# Patient Record
Sex: Male | Born: 1950 | Race: White | Hispanic: No | Marital: Married | Smoking: Never smoker
Health system: Southern US, Community
[De-identification: ages and names within clinical notes are randomized; demographics above are authoritative.]

## PROBLEM LIST (undated history)

## (undated) DIAGNOSIS — I10 Essential (primary) hypertension: Secondary | ICD-10-CM

## (undated) DIAGNOSIS — Z86010 Personal history of colonic polyps: Secondary | ICD-10-CM

## (undated) DIAGNOSIS — E7800 Pure hypercholesterolemia, unspecified: Secondary | ICD-10-CM

## (undated) HISTORY — PX: COLONOSCOPY: SHX174

## (undated) HISTORY — DX: Essential (primary) hypertension: I10

## (undated) HISTORY — DX: Pure hypercholesterolemia, unspecified: E78.00

## (undated) HISTORY — PX: TONSILLECTOMY: SUR1361

## (undated) HISTORY — PX: CYST REMOVAL NECK: SHX6281

---
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
## 2007-03-03 ENCOUNTER — Encounter: Payer: Self-pay | Admitting: Internal Medicine

## 2008-03-21 ENCOUNTER — Encounter: Payer: Self-pay | Admitting: Internal Medicine

## 2008-10-16 ENCOUNTER — Encounter (INDEPENDENT_AMBULATORY_CARE_PROVIDER_SITE_OTHER): Payer: Self-pay | Admitting: *Deleted

## 2008-10-17 ENCOUNTER — Encounter: Payer: Self-pay | Admitting: Internal Medicine

## 2008-10-24 ENCOUNTER — Ambulatory Visit: Payer: Self-pay | Admitting: Internal Medicine

## 2008-11-14 ENCOUNTER — Ambulatory Visit: Payer: Self-pay | Admitting: Internal Medicine

## 2010-06-09 ENCOUNTER — Ambulatory Visit
Admission: RE | Admit: 2010-06-09 | Discharge: 2010-06-09 | Payer: Self-pay | Source: Home / Self Care | Attending: Surgery | Admitting: Surgery

## 2011-06-18 ENCOUNTER — Other Ambulatory Visit: Payer: Self-pay | Admitting: Family Medicine

## 2011-06-18 ENCOUNTER — Ambulatory Visit
Admission: RE | Admit: 2011-06-18 | Discharge: 2011-06-18 | Disposition: A | Discharge status: Home / Self Care | Payer: BC Managed Care – PPO | Source: Ambulatory Visit | Attending: Family Medicine | Admitting: Family Medicine

## 2011-06-18 DIAGNOSIS — M79673 Pain in unspecified foot: Secondary | ICD-10-CM

## 2014-02-19 ENCOUNTER — Other Ambulatory Visit: Payer: Self-pay | Admitting: Family Medicine

## 2014-02-19 ENCOUNTER — Ambulatory Visit
Admission: RE | Admit: 2014-02-19 | Discharge: 2014-02-19 | Disposition: A | Discharge status: Home / Self Care | Payer: BC Managed Care – PPO | Source: Ambulatory Visit | Attending: Family Medicine | Admitting: Family Medicine

## 2014-02-19 DIAGNOSIS — R0602 Shortness of breath: Secondary | ICD-10-CM

## 2014-06-26 ENCOUNTER — Encounter: Payer: Self-pay | Admitting: Internal Medicine

## 2015-10-23 ENCOUNTER — Other Ambulatory Visit: Payer: Self-pay | Admitting: Family Medicine

## 2015-10-23 ENCOUNTER — Ambulatory Visit
Admission: RE | Admit: 2015-10-23 | Discharge: 2015-10-23 | Disposition: A | Discharge status: Home / Self Care | Payer: BLUE CROSS/BLUE SHIELD | Source: Ambulatory Visit | Attending: Family Medicine | Admitting: Family Medicine

## 2015-10-23 DIAGNOSIS — M25562 Pain in left knee: Secondary | ICD-10-CM

## 2018-08-14 ENCOUNTER — Other Ambulatory Visit: Payer: Self-pay | Admitting: Family Medicine

## 2018-08-14 ENCOUNTER — Ambulatory Visit
Admission: RE | Admit: 2018-08-14 | Discharge: 2018-08-14 | Disposition: A | Discharge status: Home / Self Care | Payer: BLUE CROSS/BLUE SHIELD | Source: Ambulatory Visit | Attending: Family Medicine | Admitting: Family Medicine

## 2018-08-14 DIAGNOSIS — R52 Pain, unspecified: Secondary | ICD-10-CM

## 2018-08-25 ENCOUNTER — Encounter (INDEPENDENT_AMBULATORY_CARE_PROVIDER_SITE_OTHER): Payer: Self-pay | Admitting: Orthopaedic Surgery

## 2018-08-25 ENCOUNTER — Other Ambulatory Visit: Payer: Self-pay

## 2018-08-25 ENCOUNTER — Ambulatory Visit (INDEPENDENT_AMBULATORY_CARE_PROVIDER_SITE_OTHER): Payer: BLUE CROSS/BLUE SHIELD | Admitting: Orthopaedic Surgery

## 2018-08-25 VITALS — BP 163/100 | HR 70 | Ht 71.0 in | Wt 270.0 lb

## 2018-08-25 DIAGNOSIS — M25562 Pain in left knee: Secondary | ICD-10-CM

## 2018-08-25 DIAGNOSIS — G8929 Other chronic pain: Secondary | ICD-10-CM

## 2018-08-25 NOTE — Progress Notes (Signed)
Office Visit Note   Patient: Miguel Peterson           Date of Birth: 1950-12-08           MRN: [id number] Visit Date: 08/25/2018              Requested by: Derinda Late, MD [address] [place], [place] [address] PCP: Derinda Late, MD   Assessment & Plan: Visit Diagnoses:  1. Chronic pain of left knee     Plan: Episodic left knee pain over period of several months.  No history of injury or trauma.  There may be a little bit of swelling.  Most of the pain seems to be localized along the lateral compartment.  I think there is several diagnostic possibilities including some localized arthritis and possibly even a tear of the lateral meniscus.  I have had a long discussion regarding treatment options and including cortisone injection, NSAIDs and possibly even an MRI scan.  Presently not having too much trouble so I would suggest just using anti-inflammatory medicines and activity as tolerated.  Follow-Up Instructions: Return if symptoms worsen or fail to improve.   Orders:  No orders of the defined types were placed in this encounter.  No orders of the defined types were placed in this encounter.     Procedures: No procedures performed   Clinical Data: No additional findings.   Subjective: Chief Complaint  Patient presents with  . Left Knee - Pain  Patient presents today with left knee pain X 5 weeks. No known injury. He said that is hurts on the posterior and lateral sides of his knee. He said that it feels swollen. No popping, clicking, grinding, or catching. He said that the pain occurs randomly. He saw his PCP and had x-rays taken on 08/14/2018. I did review the films of his right knee in the PACS system.  There is some mild degenerative changes in all 3 compartments.  The joint spaces are well-maintained.  There is very minimal varus position.  HPI  Review of Systems   Objective: Vital Signs: BP (!) 163/100   Pulse 70   Ht 5\' 11"  (1.803 m)   Wt 270 lb  (122.5 kg)   BMI 37.66 kg/m   Physical Exam Constitutional:      Appearance: He is well-developed.  Eyes:     Pupils: Pupils are equal, round, and reactive to light.  Pulmonary:     Effort: Pulmonary effort is normal.  Skin:    General: Skin is warm and dry.  Neurological:     Mental Status: He is alert and oriented to person, place, and time.  Psychiatric:        Behavior: Behavior normal.     Ortho Exam awake alert and oriented x3.  Comfortable sitting.  Straight leg raise negative.  Painless range of motion both hips.  Left knee without effusion.  Little bit of patella crepitation but no pain.  Some pain along the lateral compartment but quite mild.  No pain medially.  Full extension and flexion over 105 degrees.  No instability.  No calf pain or popliteal fullness.  Neurologically intact Specialty Comments:  No specialty comments available.  Imaging: No results found.   PMFS History: There are no active problems to display for this patient.  Past Medical History:  Diagnosis Date  . High cholesterol     History reviewed. No pertinent family history.  History reviewed. No pertinent surgical history. Social History  Occupational History  . Not on file  Tobacco Use  . Smoking status: Never Smoker  . Smokeless tobacco: Never Used  Substance and Sexual Activity  . Alcohol use: Yes  . Drug use: Never  . Sexual activity: Not on file

## 2018-10-25 ENCOUNTER — Encounter: Payer: Self-pay | Admitting: Internal Medicine

## 2018-10-26 ENCOUNTER — Encounter: Payer: Self-pay | Admitting: Internal Medicine

## 2018-11-21 ENCOUNTER — Other Ambulatory Visit: Payer: Self-pay

## 2018-11-21 ENCOUNTER — Ambulatory Visit: Payer: BC Managed Care – PPO

## 2018-11-21 VITALS — Ht 71.0 in | Wt 270.0 lb

## 2018-11-21 DIAGNOSIS — Z1211 Encounter for screening for malignant neoplasm of colon: Secondary | ICD-10-CM

## 2018-11-21 NOTE — Progress Notes (Signed)
Per pt, no allergies to soy or egg products.Pt not taking any weight loss meds or using  O2 at home. Pt denies sedation problems.   Pt refused emmi video.  The PV was done over the phone due to COVID-19. I verified the pt's address and insurance. I reviewed prep instructions and medical hx with the pt and will mail his instructions to him today. Informed the pt to call with any questions or changes prior to his procedure.  He understood.

## 2018-12-04 ENCOUNTER — Telehealth: Payer: Self-pay | Admitting: Internal Medicine

## 2018-12-04 NOTE — Telephone Encounter (Signed)
Spoke with patient regarding Covid-19 screening questions °Covid-19 Screening Questions ° °Do you now or have you had a fever in the last 14 days?     No ° °Do you have any respiratory symptoms of shortness of breath or cough now or in the last 14 days?    No ° °Do you have any family members or close contacts with diagnosed or suspected Covid-19 in the past 14 days?     No ° °Have you been tested for Covid-19 and found to be positive?    No ° °Pt made aware of that care partner may wait in the car or come up to the lobby during the procedure but will need to provide their own mask. ° °  °

## 2018-12-05 ENCOUNTER — Encounter: Payer: Self-pay | Admitting: Internal Medicine

## 2018-12-05 ENCOUNTER — Ambulatory Visit (AMBULATORY_SURGERY_CENTER): Payer: BC Managed Care – PPO | Admitting: Internal Medicine

## 2018-12-05 ENCOUNTER — Other Ambulatory Visit: Payer: Self-pay

## 2018-12-05 VITALS — BP 147/79 | HR 64 | Temp 97.4°F | Resp 14 | Ht 71.0 in | Wt 270.0 lb

## 2018-12-05 DIAGNOSIS — D122 Benign neoplasm of ascending colon: Secondary | ICD-10-CM

## 2018-12-05 DIAGNOSIS — Z1211 Encounter for screening for malignant neoplasm of colon: Secondary | ICD-10-CM

## 2018-12-05 DIAGNOSIS — D123 Benign neoplasm of transverse colon: Secondary | ICD-10-CM | POA: Diagnosis not present

## 2018-12-05 MED ORDER — SODIUM CHLORIDE 0.9 % IV SOLN: 500.0000 mL | Freq: Once | INTRAVENOUS | Status: DC

## 2018-12-05 NOTE — Progress Notes (Signed)
Report to PACU, RN, vss, BBS= Clear.  

## 2018-12-05 NOTE — Progress Notes (Signed)
Pt's states no medical or surgical changes since previsit or office visit.  Temp CW Vitals JB 

## 2018-12-05 NOTE — Progress Notes (Signed)
Called to room to assist during endoscopic procedure.  Patient ID and intended procedure confirmed with present staff. Received instructions for my participation in the procedure from the performing physician.  

## 2018-12-05 NOTE — Op Note (Signed)
[facility] Patient Name: Miguel Peterson Procedure Date: 12/05/2018 9:11 AM MRN: [id number] Endoscopist: Gatha Mayer , MD Age: 68 Referring MD:  Date of Birth: 1951-05-24 Gender: Male Account #: [id number] Procedure:                Colonoscopy Indications:              Screening for colorectal malignant neoplasm, Last                            colonoscopy: 2010 Medicines:                Propofol per Anesthesia, Monitored Anesthesia Care Procedure:                Pre-Anesthesia Assessment:                           - Prior to the procedure, a History and Physical                            was performed, and patient medications and                            allergies were reviewed. The patient's tolerance of                            previous anesthesia was also reviewed. The risks                            and benefits of the procedure and the sedation                            options and risks were discussed with the patient.                            All questions were answered, and informed consent                            was obtained. Prior Anticoagulants: The patient has                            taken no previous anticoagulant or antiplatelet                            agents. ASA Grade Assessment: II - A patient with                            mild systemic disease. After reviewing the risks                            and benefits, the patient was deemed in                            satisfactory condition to undergo the procedure.  After obtaining informed consent, the colonoscope                            was passed under direct vision. Throughout the                            procedure, the patient's blood pressure, pulse, and                            oxygen saturations were monitored continuously. The                            Colonoscope was introduced through the anus and                            advanced to the the  cecum, identified by                            appendiceal orifice and ileocecal valve. The                            colonoscopy was performed without difficulty. The                            patient tolerated the procedure well. The quality                            of the bowel preparation was good. The bowel                            preparation used was Miralax via split dose                            instruction. Anatomical landmarks were photographed. Scope In: 9:21:54 AM Scope Out: 9:39:38 AM Scope Withdrawal Time: 0 hours 8 minutes 10 seconds  Total Procedure Duration: 0 hours 17 minutes 44 seconds  Findings:                 The perianal and digital rectal examinations were                            normal.                           Four sessile polyps were found in the hepatic                            flexure. The polyps were diminutive in size. These                            polyps were removed with a cold snare. Resection                            and retrieval were complete. Verification of  patient identification for the specimen was done.                            Estimated blood loss was minimal.                           Multiple diverticula were found in the sigmoid                            colon and descending colon.                           Internal hemorrhoids were found.                           The exam was otherwise without abnormality on                            direct and retroflexion views. Complications:            No immediate complications. Estimated Blood Loss:     Estimated blood loss was minimal. Impression:               - Four diminutive polyps at the hepatic flexure,                            removed with a cold snare. Resected and retrieved.                           - Diverticulosis in the sigmoid colon and in the                            descending colon.                           - Internal  hemorrhoids.                           - The examination was otherwise normal on direct                            and retroflexion views. Recommendation:           - Patient has a contact number available for                            emergencies. The signs and symptoms of potential                            delayed complications were discussed with the                            patient. Return to normal activities tomorrow.                            Written discharge instructions were provided to the  patient.                           - Resume previous diet.                           - Continue present medications.                           - Repeat colonoscopy is recommended. The                            colonoscopy date will be determined after pathology                            results from today's exam become available for                            review. Gatha Mayer, MD 12/05/2018 9:56:07 AM This report has been signed electronically.

## 2018-12-05 NOTE — Patient Instructions (Addendum)
I found and removed 4 tiny polyps today.  I will let you know pathology results and when to have another routine colonoscopy by mail and/or My Chart.  You also have a condition called diverticulosis - common and not usually a problem. Please read the handout provided.  I appreciate the opportunity to care for you. Gatha Mayer, MD, [facility]  Polyps (handout given) Diverticulosis (handout given) Hemorrhoids (handout given)YOU HAD AN ENDOSCOPIC PROCEDURE TODAY AT [facility]:   Refer to the procedure report that was given to you for any specific questions about what was found during the examination.  If the procedure report does not answer your questions, please call your gastroenterologist to clarify.  If you requested that your care partner not be given the details of your procedure findings, then the procedure report has been included in a sealed envelope for you to review at your convenience later.  YOU SHOULD EXPECT: Some feelings of bloating in the abdomen. Passage of more gas than usual.  Walking can help get rid of the air that was put into your GI tract during the procedure and reduce the bloating. If you had a lower endoscopy (such as a colonoscopy or flexible sigmoidoscopy) you may notice spotting of blood in your stool or on the toilet paper. If you underwent a bowel prep for your procedure, you may not have a normal bowel movement for a few days.  Please Note:  You might notice some irritation and congestion in your nose or some drainage.  This is from the oxygen used during your procedure.  There is no need for concern and it should clear up in a day or so.  SYMPTOMS TO REPORT IMMEDIATELY:   Following lower endoscopy (colonoscopy or flexible sigmoidoscopy):  Excessive amounts of blood in the stool  Significant tenderness or worsening of abdominal pains  Swelling of the abdomen that is new, acute  Fever of 100F or higher   For urgent or emergent  issues, a gastroenterologist can be reached at any hour by calling [phone].   DIET:  We do recommend a small meal at first, but then you may proceed to your regular diet.  Drink plenty of fluids but you should avoid alcoholic beverages for 24 hours.  ACTIVITY:  You should plan to take it easy for the rest of today and you should NOT DRIVE or use heavy machinery until tomorrow (because of the sedation medicines used during the test).    FOLLOW UP: Our staff will call the number listed on your records 48-72 hours following your procedure to check on you and address any questions or concerns that you may have regarding the information given to you following your procedure. If we do not reach you, we will leave a message.  We will attempt to reach you two times.  During this call, we will ask if you have developed any symptoms of COVID 19. If you develop any symptoms (ie: fever, flu-like symptoms, shortness of breath, cough etc.) before then, please call [phone].  If you test positive for Covid 19 in the 2 weeks post procedure, please call and report this information to [place].    If any biopsies were taken you will be contacted by phone or by letter within the next 1-3 weeks.  Please call us at [phone] if you have not heard about the biopsies in 3 weeks.    SIGNATURES/CONFIDENTIALITY: You and/or your care partner have signed paperwork which will  be entered into your electronic medical record.  These signatures attest to the fact that that the information above on your After Visit Summary has been reviewed and is understood.  Full responsibility of the confidentiality of this discharge information lies with you and/or your care-partner. 

## 2018-12-07 ENCOUNTER — Telehealth: Payer: Self-pay

## 2018-12-07 NOTE — Telephone Encounter (Signed)
  Follow up Call-  Call back number 12/05/2018  Post procedure Call Back phone  # [phone]  Permission to leave phone message Yes  Some recent data might be hidden     Patient questions:  Do you have a fever, pain , or abdominal swelling? No. Pain Score  0 *  Have you tolerated food without any problems? Yes.    Have you been able to return to your normal activities? Yes.    Do you have any questions about your discharge instructions: Diet   No. Medications  No. Follow up visit  No.  Do you have questions or concerns about your Care? No.  Actions: * If pain score is 4 or above: No action needed, pain <4.  1. Have you developed a fever since your procedure? No  2.   Have you had an respiratory symptoms (SOB or cough) since your procedure? no  3.   Have you tested positive for COVID 19 since your procedure no  4.   Have you had any family members/close contacts diagnosed with the COVID 19 since your procedure?  no   If yes to any of these questions please route to Joylene John, RN and Alphonsa Gin, Therapist, sports.

## 2018-12-12 ENCOUNTER — Encounter: Payer: Self-pay | Admitting: Internal Medicine

## 2018-12-12 DIAGNOSIS — Z860101 Personal history of adenomatous and serrated colon polyps: Secondary | ICD-10-CM

## 2018-12-12 DIAGNOSIS — Z86010 Personal history of colonic polyps: Secondary | ICD-10-CM

## 2018-12-12 HISTORY — DX: Personal history of adenomatous and serrated colon polyps: Z86.0101

## 2018-12-12 HISTORY — DX: Personal history of colonic polyps: Z86.010

## 2018-12-12 NOTE — Progress Notes (Signed)
4 diminutive adenomas Recall 2025 My Chart letter

## 2019-05-18 ENCOUNTER — Other Ambulatory Visit: Payer: Self-pay | Admitting: Family Medicine

## 2019-05-18 DIAGNOSIS — R7401 Elevation of levels of liver transaminase levels: Secondary | ICD-10-CM

## 2019-05-18 DIAGNOSIS — R945 Abnormal results of liver function studies: Secondary | ICD-10-CM

## 2019-05-30 ENCOUNTER — Ambulatory Visit
Admission: RE | Admit: 2019-05-30 | Discharge: 2019-05-30 | Disposition: A | Discharge status: Home / Self Care | Payer: BC Managed Care – PPO | Source: Ambulatory Visit | Attending: Family Medicine | Admitting: Family Medicine

## 2019-05-30 DIAGNOSIS — R7401 Elevation of levels of liver transaminase levels: Secondary | ICD-10-CM

## 2019-05-30 DIAGNOSIS — R945 Abnormal results of liver function studies: Secondary | ICD-10-CM

## 2019-06-06 ENCOUNTER — Other Ambulatory Visit: Payer: Self-pay | Admitting: Family Medicine

## 2019-06-06 DIAGNOSIS — R9389 Abnormal findings on diagnostic imaging of other specified body structures: Secondary | ICD-10-CM

## 2019-06-13 ENCOUNTER — Ambulatory Visit
Admission: RE | Admit: 2019-06-13 | Discharge: 2019-06-13 | Disposition: A | Discharge status: Home / Self Care | Payer: 59 | Source: Ambulatory Visit | Attending: Family Medicine | Admitting: Family Medicine

## 2019-06-13 DIAGNOSIS — R9389 Abnormal findings on diagnostic imaging of other specified body structures: Secondary | ICD-10-CM

## 2019-06-13 MED ORDER — IOPAMIDOL (ISOVUE-300) INJECTION 61%
100.0000 mL | Freq: Once | INTRAVENOUS | Status: AC | PRN
  Administered 2019-06-13: 100 mL via INTRAVENOUS

## 2019-09-06 ENCOUNTER — Ambulatory Visit
Admission: RE | Admit: 2019-09-06 | Discharge: 2019-09-06 | Disposition: A | Discharge status: Home / Self Care | Payer: 59 | Source: Ambulatory Visit | Attending: Family Medicine | Admitting: Family Medicine

## 2019-09-06 ENCOUNTER — Other Ambulatory Visit: Payer: Self-pay | Admitting: Family Medicine

## 2019-09-06 ENCOUNTER — Other Ambulatory Visit: Payer: Self-pay

## 2019-09-06 DIAGNOSIS — R079 Chest pain, unspecified: Secondary | ICD-10-CM

## 2019-09-09 NOTE — Progress Notes (Signed)
Cardiology Office Note:    Date:  09/10/2019   ID:  Miguel Peterson, DOB 1951-05-24, MRN [id number]  PCP:  Derinda Late, MD  Cardiologist:  No primary care provider on file.  Electrophysiologist:  None   Referring MD: Derinda Late, MD   Chief Complaint  Patient presents with  . Chest Pain    History of Present Illness:    Miguel Peterson is a 69 y.o. male with a hx of hyperlipidemia who is referred by Dr. Sandi Mariscal for an evaluation of chest pain.  Reports that starting on 3/31 he began having chest pain in the left side of his chest.  Describes as dull aching pain, 1-2 out of 10 in intensity.  Did not resolve so on 4/1 he went to his PCP where he was noted to have subtle changes on his EKG.  Chest x-ray was done which was negative.  Reports pain resolved on 4/2.  Did note some worsening in chest pain with certain movements but denies any relationship with exertion.  Reports that he walks 2-3 times per week for 30 to 45 minutes.  Has not noted exertional chest pain.  He has been on Lipitor for hyperlipidemia, but currently being held due to transaminitis.  BP elevated in clinic today, but he denies any history of hypertension.  He has been off Lipitor for about 1 month.  No smoking history.  Reports father had MI in 57s.    Past Medical History:  Diagnosis Date  . High cholesterol   . Hx of adenomatous colonic polyps 12/12/2018    Past Surgical History:  Procedure Laterality Date  . COLONOSCOPY    . CYST REMOVAL NECK     benign/ long time ago per pt.  . TONSILLECTOMY     as a child    Current Medications: Current Meds  Medication Sig  . atorvastatin (LIPITOR) 80 MG tablet Take 80 mg by mouth daily.  . Multiple Vitamins-Minerals (CENTRUM SILVER 50+MEN PO) Take by mouth.  . Omega-3 Fatty Acids (FISH OIL) 1200 MG CAPS Take by mouth.  . Vitamin D, Cholecalciferol, 25 MCG (1000 UT) CAPS Take 25 mcg by mouth.  . [DISCONTINUED] atorvastatin (LIPITOR) 40 MG tablet daily with  supper.    Current Facility-Administered Medications for the 09/10/19 encounter (Office Visit) with Donato Heinz, MD  Medication  . 0.9 %  sodium chloride infusion     Allergies:   Patient has no known allergies.   Social History   Socioeconomic History  . Marital status: Married    Spouse name: Not on file  . Number of children: Not on file  . Years of education: Not on file  . Highest education level: Not on file  Occupational History  . Occupation: Designer, jewellery: [employer]  Tobacco Use  . Smoking status: Never Smoker  . Smokeless tobacco: Never Used  Substance and Sexual Activity  . Alcohol use: Yes    Comment: 24 oz of beer  . Drug use: Never  . Sexual activity: Not on file  Other Topics Concern  . Not on file  Social History Narrative  . Not on file   Social Determinants of Health   Financial Resource Strain:   . Difficulty of Paying Living Expenses:   Food Insecurity:   . Worried About Charity fundraiser in the Last Year:   . Arboriculturist in the Last Year:   Transportation Needs:   . Lack of Transportation (  Medical):   Marland Kitchen Lack of Transportation (Non-Medical):   Physical Activity:   . Days of Exercise per Week:   . Minutes of Exercise per Session:   Stress:   . Feeling of Stress :   Social Connections:   . Frequency of Communication with Friends and Family:   . Frequency of Social Gatherings with Friends and Family:   . Attends Religious Services:   . Active Member of Clubs or Organizations:   . Attends Archivist Meetings:   Marland Kitchen Marital Status:      Family History: The patient's family history includes Healthy in his brother, brother, sister, and sister; Heart disease in his father.  ROS:   Please see the history of present illness.     All other systems reviewed and are negative.  EKGs/Labs/Other Studies Reviewed:    The following studies were reviewed today:   EKG:  EKG is  ordered today.  The ekg  ordered today demonstrates normal sinus rhythm, rate 68, nonspecific T wave flattening less than 1 mm ST depressions in leads II, 3, aVF  Recent Labs: No results found for requested labs within last 8760 hours.  Recent Lipid Panel No results found for: CHOL, TRIG, HDL, CHOLHDL, VLDL, LDLCALC, LDLDIRECT  Physical Exam:    VS:  BP (!) 160/80   Pulse 68   Temp (!) 97.2 F (36.2 C)   Ht 6' (1.829 m)   Wt (!) 310 lb 9.6 oz (140.9 kg)   SpO2 96%   BMI 42.12 kg/m     Wt Readings from Last 3 Encounters:  09/10/19 (!) 310 lb 9.6 oz (140.9 kg)  12/05/18 270 lb (122.5 kg)  11/21/18 270 lb (122.5 kg)     GEN:  Well nourished, well developed in no acute distress HEENT: Normal NECK: No JVD; No carotid bruits LYMPHATICS: No lymphadenopathy CARDIAC: RRR, no murmurs, rubs, gallops RESPIRATORY:  Clear to auscultation without rales, wheezing or rhonchi  ABDOMEN: Soft, non-tender, non-distended MUSCULOSKELETAL:  No edema; No deformity  SKIN: Warm and dry NEUROLOGIC:  Alert and oriented x 3 PSYCHIATRIC:  Normal affect   ASSESSMENT:    1. Chest pain, unspecified type   2. Hyperlipidemia, unspecified hyperlipidemia type   3. Pre-procedure lab exam   4. Elevated BP without diagnosis of hypertension    PLAN:     Chest pain: Atypical in description but does have significant CAD risk factors (age, hyperlipidemia, suspected hypertension).  Subtle ST depressions on EKG.  Recommend further evaluation with coronary CTA  Hyperlipidemia: Has been on atorvastatin 80 mg daily, but held over the last month due to transaminitis.  Will check lipid panel and LFTs.  Will follow up coronary CTA to to guide how aggressive to be in lowering cholesterol  Elevated BP: 160/80 in clinic today.  No history of hypertension.  Discussed starting antihypertensive, but he states that he would like to follow-up with his PCP.  Recommended checking BP at home and bringing log to future clinic visits.  RTC in 3  months  Medication Adjustments/Labs and Tests Ordered: Current medicines are reviewed at length with the patient today.  Concerns regarding medicines are outlined above.  Orders Placed This Encounter  Procedures  . CT CORONARY MORPH W/CTA COR W/SCORE W/CA W/CM &/OR WO/CM  . CT CORONARY FRACTIONAL FLOW RESERVE DATA PREP  . CT CORONARY FRACTIONAL FLOW RESERVE FLUID ANALYSIS  . Comprehensive metabolic panel  . Lipid panel  . EKG 12-Lead   Meds ordered this encounter  Medications  . metoprolol tartrate (LOPRESSOR) 100 MG tablet    Sig: Take 100 mg (1 tablet) TWO hours prior to CT    Dispense:  1 tablet    Refill:  0    Patient Instructions  Medication Instructions:  Your physician recommends that you continue on your current medications as directed. Please refer to the Current Medication list given to you today.  Take metoprolol tartrate (Lopressor) 100 mg TWO HOURS prior to CT  Lab Work: Today (CMET, Lipid)  If you have labs (blood work) drawn today and your tests are completely normal, you will receive your results only by: Marland Kitchen MyChart Message (if you have MyChart) OR . A paper copy in the mail If you have any lab test that is abnormal or we need to change your treatment, we will call you to review the results.   Testing/Procedures: Your physician has requested that you have cardiac CT. Cardiac computed tomography (CT) is a painless test that uses an x-ray machine to take clear, detailed pictures of your heart. For further information please visit [email or web address].[email or web address]. Please follow instruction sheet as given.  Follow-Up: At [facility], you and your health needs are our priority.  As part of our continuing mission to provide you with exceptional heart care, we have created designated Provider Care Teams.  These Care Teams include your primary Cardiologist (physician) and Advanced Practice Providers (APPs -  Physician Assistants and Nurse Practitioners) who all work together to  provide you with the care you need, when you need it.  We recommend signing up for the patient portal called "MyChart".  Sign up information is provided on this After Visit Summary.  MyChart is used to connect with patients for Virtual Visits (Telemedicine).  Patients are able to view lab/test results, encounter notes, upcoming appointments, etc.  Non-urgent messages can be sent to your provider as well.   To learn more about what you can do with MyChart, go to [email or web address].    Your next appointment:   3 month(s)  The format for your next appointment:   In Person  Provider:   Oswaldo Milian, MD     ---Follow up with primary care doctor in regards to your blood pressure.   We recommend getting a blood pressure cuff at home.  Take blood pressure once daily and write it down.  Bring readings to future appointments.     Signed, Donato Heinz, MD  09/10/2019 12:41 PM    [place]

## 2019-09-10 ENCOUNTER — Other Ambulatory Visit: Payer: Self-pay

## 2019-09-10 ENCOUNTER — Encounter: Payer: Self-pay | Admitting: Cardiology

## 2019-09-10 ENCOUNTER — Ambulatory Visit (INDEPENDENT_AMBULATORY_CARE_PROVIDER_SITE_OTHER): Payer: 59 | Admitting: Cardiology

## 2019-09-10 VITALS — BP 160/80 | HR 68 | Temp 97.2°F | Ht 72.0 in | Wt 280.0 lb

## 2019-09-10 DIAGNOSIS — R079 Chest pain, unspecified: Secondary | ICD-10-CM | POA: Diagnosis not present

## 2019-09-10 DIAGNOSIS — Z01812 Encounter for preprocedural laboratory examination: Secondary | ICD-10-CM

## 2019-09-10 DIAGNOSIS — E785 Hyperlipidemia, unspecified: Secondary | ICD-10-CM | POA: Diagnosis not present

## 2019-09-10 DIAGNOSIS — R030 Elevated blood-pressure reading, without diagnosis of hypertension: Secondary | ICD-10-CM

## 2019-09-10 LAB — COMPREHENSIVE METABOLIC PANEL
ALT: 58 IU/L — ABNORMAL HIGH (ref 0–44)
AST: 35 IU/L (ref 0–40)
Albumin/Globulin Ratio: 1.9 (ref 1.2–2.2)
Albumin: 4.4 g/dL (ref 3.8–4.8)
Alkaline Phosphatase: 180 IU/L — ABNORMAL HIGH (ref 39–117)
BUN/Creatinine Ratio: 12 (ref 10–24)
BUN: 11 mg/dL (ref 8–27)
Bilirubin Total: 0.5 mg/dL (ref 0.0–1.2)
CO2: 22 mmol/L (ref 20–29)
Calcium: 9.3 mg/dL (ref 8.6–10.2)
Chloride: 105 mmol/L (ref 96–106)
Creatinine, Ser: 0.89 mg/dL (ref 0.76–1.27)
GFR calc Af Amer: 102 mL/min/{1.73_m2} (ref >59)
GFR calc non Af Amer: 88 mL/min/{1.73_m2} (ref >59)
Globulin, Total: 2.3 g/dL (ref 1.5–4.5)
Glucose: 123 mg/dL — ABNORMAL HIGH (ref 65–99)
Potassium: 4.4 mmol/L (ref 3.5–5.2)
Sodium: 139 mmol/L (ref 134–144)
Total Protein: 6.7 g/dL (ref 6.0–8.5)

## 2019-09-10 LAB — LIPID PANEL
Chol/HDL Ratio: 4.1 ratio (ref 0.0–5.0)
Cholesterol, Total: 203 mg/dL — ABNORMAL HIGH (ref 100–199)
HDL: 50 mg/dL (ref >39)
LDL Chol Calc (NIH): 130 mg/dL — ABNORMAL HIGH (ref 0–99)
Triglycerides: 127 mg/dL (ref 0–149)
VLDL Cholesterol Cal: 23 mg/dL (ref 5–40)

## 2019-09-10 MED ORDER — METOPROLOL TARTRATE 100 MG PO TABS: ORAL_TABLET | ORAL | 0 refills | Status: DC

## 2019-09-10 NOTE — Patient Instructions (Addendum)
Medication Instructions:  Your physician recommends that you continue on your current medications as directed. Please refer to the Current Medication list given to you today.  Take metoprolol tartrate (Lopressor) 100 mg TWO HOURS prior to CT  Lab Work: Today (CMET, Lipid)  If you have labs (blood work) drawn today and your tests are completely normal, you will receive your results only by: Marland Kitchen MyChart Message (if you have MyChart) OR . A paper copy in the mail If you have any lab test that is abnormal or we need to change your treatment, we will call you to review the results.   Testing/Procedures: Your physician has requested that you have cardiac CT. Cardiac computed tomography (CT) is a painless test that uses an x-ray machine to take clear, detailed pictures of your heart. For further information please visit [email or web address]. Please follow instruction sheet as given.  Follow-Up: At [facility], you and your health needs are our priority.  As part of our continuing mission to provide you with exceptional heart care, we have created designated Provider Care Teams.  These Care Teams include your primary Cardiologist (physician) and Advanced Practice Providers (APPs -  Physician Assistants and Nurse Practitioners) who all work together to provide you with the care you need, when you need it.  We recommend signing up for the patient portal called "MyChart".  Sign up information is provided on this After Visit Summary.  MyChart is used to connect with patients for Virtual Visits (Telemedicine).  Patients are able to view lab/test results, encounter notes, upcoming appointments, etc.  Non-urgent messages can be sent to your provider as well.   To learn more about what you can do with MyChart, go to [email or web address].    Your next appointment:   3 month(s)  The format for your next appointment:   In Person  Provider:   Oswaldo Milian, MD     ---Follow up with primary  care doctor in regards to your blood pressure.   We recommend getting a blood pressure cuff at home.  Take blood pressure once daily and write it down.  Bring readings to future appointments.

## 2019-09-26 ENCOUNTER — Telehealth (HOSPITAL_COMMUNITY): Payer: Self-pay | Admitting: Emergency Medicine

## 2019-09-26 ENCOUNTER — Encounter (HOSPITAL_COMMUNITY): Payer: Self-pay

## 2019-09-26 NOTE — Telephone Encounter (Signed)
Attempted to call patient regarding upcoming cardiac CT appointment. °Left message on voicemail with name and callback number °Kailash Hinze RN Navigator Cardiac Imaging °[facility] °[phone] Office °[phone] Cell ° °

## 2019-09-27 ENCOUNTER — Other Ambulatory Visit: Payer: Self-pay

## 2019-09-27 ENCOUNTER — Ambulatory Visit (HOSPITAL_COMMUNITY)
Admission: RE | Admit: 2019-09-27 | Discharge: 2019-09-27 | Disposition: A | Discharge status: Home / Self Care | Payer: 59 | Source: Ambulatory Visit | Attending: Cardiology | Admitting: Cardiology

## 2019-09-27 DIAGNOSIS — R079 Chest pain, unspecified: Secondary | ICD-10-CM | POA: Diagnosis not present

## 2019-09-27 MED ORDER — IOHEXOL 350 MG/ML SOLN
80.0000 mL | Freq: Once | INTRAVENOUS | Status: AC | PRN
  Administered 2019-09-27: 80 mL via INTRAVENOUS

## 2019-09-27 MED ORDER — NITROGLYCERIN 0.4 MG SL SUBL
SUBLINGUAL_TABLET | SUBLINGUAL | Status: AC
  Filled 2019-09-27: qty 2

## 2019-09-27 MED ORDER — NITROGLYCERIN 0.4 MG SL SUBL
0.8000 mg | SUBLINGUAL_TABLET | Freq: Once | SUBLINGUAL | Status: AC
  Administered 2019-09-27: 0.8 mg via SUBLINGUAL

## 2019-11-13 ENCOUNTER — Telehealth: Payer: Self-pay | Admitting: Cardiology

## 2019-11-13 NOTE — Telephone Encounter (Signed)
LMOM RE: F/U Appt 

## 2019-12-10 NOTE — Progress Notes (Deleted)
Cardiology Office Note:    Date:  12/10/2019   ID:  Miguel Peterson, DOB April 27, 1951, MRN [id number]  PCP:  Derinda Late, MD  Cardiologist:  No primary care provider on file.  Electrophysiologist:  None   Referring MD: Derinda Late, MD   No chief complaint on file.   History of Present Illness:    Miguel Peterson is a 69 y.o. male with a hx of hyperlipidemia who presents for follow-up.  He was referred by Dr. Sandi Mariscal for an evaluation of chest pain, initially seen on 09/10/2019.  Reports that starting on 3/31 he began having chest pain in the left side of his chest.  Describes as dull aching pain, 1-2 out of 10 in intensity.  Did not resolve so on 4/1 he went to his PCP where he was noted to have subtle changes on his EKG.  Chest x-ray was done which was negative.  Reports pain resolved on 4/2.  Did note some worsening in chest pain with certain movements but denies any relationship with exertion.  Reports that he walks 2-3 times per week for 30 to 45 minutes.  Has not noted exertional chest pain.  He has been on Lipitor for hyperlipidemia, but currently being held due to transaminitis.  BP elevated in clinic today, but he denies any history of hypertension.  He has been off Lipitor for about 1 month.  No smoking history.  Reports father had MI in 45s.   Coronary CTA on 09/27/2019 showed noncalcified plaque in the proximal RCA causing minimal (25 to 49%) stenosis and calcified plaque in proximal LAD and ostium of small ramus branch causing minimal (0 to 24%) stenosis.  Calcium score 14 (27th percentile).  Since last clinic visit,   Past Medical History:  Diagnosis Date  . High cholesterol   . Hx of adenomatous colonic polyps 12/12/2018    Past Surgical History:  Procedure Laterality Date  . COLONOSCOPY    . CYST REMOVAL NECK     benign/ long time ago per pt.  . TONSILLECTOMY     as a child    Current Medications: No outpatient medications have been marked as taking for the  12/11/19 encounter (Appointment) with Donato Heinz, MD.   Current Facility-Administered Medications for the 12/11/19 encounter (Appointment) with Donato Heinz, MD  Medication  . 0.9 %  sodium chloride infusion     Allergies:   Patient has no known allergies.   Social History   Socioeconomic History  . Marital status: Married    Spouse name: Not on file  . Number of children: Not on file  . Years of education: Not on file  . Highest education level: Not on file  Occupational History  . Occupation: Designer, jewellery: [employer]  Tobacco Use  . Smoking status: Never Smoker  . Smokeless tobacco: Never Used  Vaping Use  . Vaping Use: Never used  Substance and Sexual Activity  . Alcohol use: Yes    Comment: 24 oz of beer  . Drug use: Never  . Sexual activity: Not on file  Other Topics Concern  . Not on file  Social History Narrative  . Not on file   Social Determinants of Health   Financial Resource Strain:   . Difficulty of Paying Living Expenses:   Food Insecurity:   . Worried About Charity fundraiser in the Last Year:   . Arboriculturist in the Last Year:   [employer]  Needs:   . Lack of Transportation (Medical):   Marland Kitchen Lack of Transportation (Non-Medical):   Physical Activity:   . Days of Exercise per Week:   . Minutes of Exercise per Session:   Stress:   . Feeling of Stress :   Social Connections:   . Frequency of Communication with Friends and Family:   . Frequency of Social Gatherings with Friends and Family:   . Attends Religious Services:   . Active Member of Clubs or Organizations:   . Attends Archivist Meetings:   Marland Kitchen Marital Status:      Family History: The patient's family history includes Healthy in his brother, brother, sister, and sister; Heart disease in his father.  ROS:   Please see the history of present illness.     All other systems reviewed and are negative.  EKGs/Labs/Other Studies Reviewed:      The following studies were reviewed today:   EKG:  EKG is  ordered today.  The ekg ordered today demonstrates normal sinus rhythm, rate 68, nonspecific T wave flattening less than 1 mm ST depressions in leads II, 3, aVF  Recent Labs: 09/10/2019: ALT 58; BUN 11; Creatinine, Ser 0.89; Potassium 4.4; Sodium 139  Recent Lipid Panel    Component Value Date/Time   CHOL 203 (H) 09/10/2019 1003   TRIG 127 09/10/2019 1003   HDL 50 09/10/2019 1003   CHOLHDL 4.1 09/10/2019 1003   LDLCALC 130 (H) 09/10/2019 1003    Physical Exam:    VS:  There were no vitals taken for this visit.    Wt Readings from Last 3 Encounters:  09/10/19 280 lb (127 kg)  12/05/18 270 lb (122.5 kg)  11/21/18 270 lb (122.5 kg)     GEN:  Well nourished, well developed in no acute distress HEENT: Normal NECK: No JVD; No carotid bruits LYMPHATICS: No lymphadenopathy CARDIAC: RRR, no murmurs, rubs, gallops RESPIRATORY:  Clear to auscultation without rales, wheezing or rhonchi  ABDOMEN: Soft, non-tender, non-distended MUSCULOSKELETAL:  No edema; No deformity  SKIN: Warm and dry NEUROLOGIC:  Alert and oriented x 3 PSYCHIATRIC:  Normal affect   ASSESSMENT:    No diagnosis found. PLAN:     CAD: Presented with atypical chest pain.  Coronary CTA on 09/27/2019 showed noncalcified plaque in the proximal RCA causing minimal (25 to 49%) stenosis and calcified plaque in proximal LAD and ostium of small ramus branch causing minimal (0 to 24%) stenosis.  Calcium score 14 (27th percentile). -Has been on atorvastatin 80 mg daily but was held due to transaminitis.  Will recheck LFTs and plan to restart atorvastatin if stable  Hyperlipidemia: Has been on atorvastatin 80 mg daily, but held due to transaminitis.  LDL 130 on 09/10/2019 off of atorvastatin  Elevated BP: 160/80 at initial clinic visit.  No history of hypertension.  Discussed starting antihypertensive, but he states that he would like to follow-up with his PCP.   Recommended checking BP at home and bringing log to future clinic visits.  RTC in***  Medication Adjustments/Labs and Tests Ordered: Current medicines are reviewed at length with the patient today.  Concerns regarding medicines are outlined above.  No orders of the defined types were placed in this encounter.  No orders of the defined types were placed in this encounter.   There are no Patient Instructions on file for this visit.   Signed, Donato Heinz, MD  12/10/2019 9:51 PM    [facility]

## 2019-12-11 ENCOUNTER — Encounter: Payer: Self-pay | Admitting: Cardiology

## 2019-12-11 ENCOUNTER — Ambulatory Visit (INDEPENDENT_AMBULATORY_CARE_PROVIDER_SITE_OTHER): Payer: 59 | Admitting: Cardiology

## 2019-12-11 ENCOUNTER — Other Ambulatory Visit: Payer: Self-pay

## 2019-12-11 VITALS — BP 130/78 | HR 70 | Temp 96.7°F | Ht 72.0 in | Wt 285.6 lb

## 2019-12-11 DIAGNOSIS — R030 Elevated blood-pressure reading, without diagnosis of hypertension: Secondary | ICD-10-CM | POA: Diagnosis not present

## 2019-12-11 DIAGNOSIS — E785 Hyperlipidemia, unspecified: Secondary | ICD-10-CM

## 2019-12-11 DIAGNOSIS — I2510 Atherosclerotic heart disease of native coronary artery without angina pectoris: Secondary | ICD-10-CM | POA: Diagnosis not present

## 2019-12-11 MED ORDER — EZETIMIBE 10 MG PO TABS: 10.0000 mg | ORAL_TABLET | Freq: Every day | ORAL | 3 refills | Status: DC

## 2019-12-11 NOTE — Patient Instructions (Signed)
Medication Instructions:  START Zetia 10 mg daily  *If you need a refill on your cardiac medications before your next appointment, please call your pharmacy*   Follow-Up: At [facility], you and your health needs are our priority.  As part of our continuing mission to provide you with exceptional heart care, we have created designated Provider Care Teams.  These Care Teams include your primary Cardiologist (physician) and Advanced Practice Providers (APPs -  Physician Assistants and Nurse Practitioners) who all work together to provide you with the care you need, when you need it.  We recommend signing up for the patient portal called "MyChart".  Sign up information is provided on this After Visit Summary.  MyChart is used to connect with patients for Virtual Visits (Telemedicine).  Patients are able to view lab/test results, encounter notes, upcoming appointments, etc.  Non-urgent messages can be sent to your provider as well.   To learn more about what you can do with MyChart, go to [email or web address].    Your next appointment:   6 month(s)  The format for your next appointment:   In Person  Provider:   Oswaldo Milian, MD

## 2019-12-13 NOTE — Progress Notes (Signed)
Cardiology Office Note:    Date:  12/13/2019   ID:  Miguel Peterson, DOB 08-12-1950, MRN [id number]  PCP:  Miguel Late, MD  Cardiologist:  No primary care provider on file.  Electrophysiologist:  None   Referring MD: Miguel Late, MD   Chief Complaint  Patient presents with  . Coronary Artery Disease    History of Present Illness:    Miguel Peterson is a 69 y.o. male with a hx of hyperlipidemia who presents for follow-up.  He was referred by Dr. Sandi Peterson for an evaluation of chest pain, initially seen on 09/10/2019.  Reports that starting on 3/31 he began having chest pain in the left side of his chest.  Describes as dull aching pain, 1-2 out of 10 in intensity.  Did not resolve so on 4/1 he went to his PCP where he was noted to have subtle changes on his EKG.  Chest x-ray was done which was negative.  Reports pain resolved on 4/2.  Did note some worsening in chest pain with certain movements but denies any relationship with exertion.  Reports that he walks 2-3 times per week for 30 to 45 minutes.  Has not noted exertional chest pain.  He has been on Lipitor for hyperlipidemia, but currently being held due to transaminitis.  BP elevated in clinic today, but he denies any history of hypertension.  He has been off Lipitor for about 1 month.  No smoking history.  Reports father had MI in 65s.   Coronary CTA on 09/27/2019 showed noncalcified plaque in the proximal RCA causing minimal (25 to 49%) stenosis and calcified plaque in proximal LAD and ostium of small ramus branch causing minimal (0 to 24%) stenosis.  Calcium score 14 (27th percentile).  Since last clinic visit, he reports that he has been doing well.  He denies any further chest pain.  States he is walking 2-3 times per week for 20 to 30 minutes.  Denies any exertional chest pain or dyspnea.  Denies any lightheadedness, syncope, palpitations, lower extremity edema.     Past Medical History:  Diagnosis Date  . High cholesterol    . Hx of adenomatous colonic polyps 12/12/2018    Past Surgical History:  Procedure Laterality Date  . COLONOSCOPY    . CYST REMOVAL NECK     benign/ long time ago per pt.  . TONSILLECTOMY     as a child    Current Medications: Current Meds  Medication Sig  . atorvastatin (LIPITOR) 80 MG tablet Take 80 mg by mouth daily.  . Multiple Vitamins-Minerals (CENTRUM SILVER 50+MEN PO) Take by mouth.  . Omega-3 Fatty Acids (FISH OIL) 1200 MG CAPS Take by mouth.  . Vitamin D, Cholecalciferol, 25 MCG (1000 UT) CAPS Take 25 mcg by mouth.   Current Facility-Administered Medications for the 12/11/19 encounter (Office Visit) with Miguel Heinz, MD  Medication  . 0.9 %  sodium chloride infusion     Allergies:   Patient has no known allergies.   Social History   Socioeconomic History  . Marital status: Married    Spouse name: Not on file  . Number of children: Not on file  . Years of education: Not on file  . Highest education level: Not on file  Occupational History  . Occupation: Designer, jewellery: [employer]  Tobacco Use  . Smoking status: Never Smoker  . Smokeless tobacco: Never Used  Vaping Use  . Vaping Use: Never used  Substance and  Sexual Activity  . Alcohol use: Yes    Comment: 24 oz of beer  . Drug use: Never  . Sexual activity: Not on file  Other Topics Concern  . Not on file  Social History Narrative  . Not on file   Social Determinants of Health   Financial Resource Strain:   . Difficulty of Paying Living Expenses:   Food Insecurity:   . Worried About Charity fundraiser in the Last Year:   . Arboriculturist in the Last Year:   Transportation Needs:   . Film/video editor (Medical):   Marland Kitchen Lack of Transportation (Non-Medical):   Physical Activity:   . Days of Exercise per Week:   . Minutes of Exercise per Session:   Stress:   . Feeling of Stress :   Social Connections:   . Frequency of Communication with Friends and Family:   .  Frequency of Social Gatherings with Friends and Family:   . Attends Religious Services:   . Active Member of Clubs or Organizations:   . Attends Archivist Meetings:   Marland Kitchen Marital Status:      Family History: The patient's family history includes Healthy in his brother, brother, sister, and sister; Heart disease in his father.  ROS:   Please see the history of present illness.     All other systems reviewed and are negative.  EKGs/Labs/Other Studies Reviewed:    The following studies were reviewed today:   EKG:  EKG is not ordered today.  The ekg ordered at prior clinic visit demonstrates normal sinus rhythm, rate 68, nonspecific T wave flattening less than 1 mm ST depressions in leads II, 3, aVF  Recent Labs: 09/10/2019: ALT 58; BUN 11; Creatinine, Ser 0.89; Potassium 4.4; Sodium 139  Recent Lipid Panel    Component Value Date/Time   CHOL 203 (H) 09/10/2019 1003   TRIG 127 09/10/2019 1003   HDL 50 09/10/2019 1003   CHOLHDL 4.1 09/10/2019 1003   LDLCALC 130 (H) 09/10/2019 1003    Physical Exam:    VS:  BP 130/78   Pulse 70   Temp (!) 96.7 F (35.9 C)   Ht 6' (1.829 m)   Wt 285 lb 9.6 oz (129.5 kg)   SpO2 97%   BMI 38.73 kg/m     Wt Readings from Last 3 Encounters:  12/11/19 285 lb 9.6 oz (129.5 kg)  09/10/19 280 lb (127 kg)  12/05/18 270 lb (122.5 kg)     GEN:  Well nourished, well developed in no acute distress HEENT: Normal NECK: No JVD; No carotid bruits CARDIAC: RRR, no murmurs, rubs, gallops RESPIRATORY:  Clear to auscultation without rales, wheezing or rhonchi  ABDOMEN: Soft, non-tender, non-distended MUSCULOSKELETAL:  No edema; No deformity  SKIN: Warm and dry NEUROLOGIC:  Alert and oriented x 3 PSYCHIATRIC:  Normal affect   ASSESSMENT:    1. Coronary artery disease involving native coronary artery of native heart without angina pectoris   2. Hyperlipidemia, unspecified hyperlipidemia type   3. Elevated BP without diagnosis of  hypertension    PLAN:     CAD: Presented with atypical chest pain.  Coronary CTA on 09/27/2019 showed noncalcified plaque in the proximal RCA causing minimal (25 to 49%) stenosis and calcified plaque in proximal LAD and ostium of small ramus branch causing minimal (0 to 24%) stenosis.  Calcium score 14 (27th percentile). -Has been on atorvastatin 80 mg daily but was held due to transaminitis.  Has  been followed by his PCP, has repeat labs planned for later this month.  Recommended starting Zetia 10 mg daily  Hyperlipidemia: Has been on atorvastatin 80 mg daily, but held due to transaminitis.  LDL 130 on 09/10/2019 off of atorvastatin.  Recommended starting Zetia 10 mg daily  Elevated BP: 160/80 at initial clinic visit.  No history of hypertension.  Discussed starting antihypertensive, but he stated that he would like to follow-up with his PCP.  Recommended checking BP at home and bringing log to future clinic visits.  BP appears controlled today.  RTC in 6 months   Medication Adjustments/Labs and Tests Ordered: Current medicines are reviewed at length with the patient today.  Concerns regarding medicines are outlined above.  No orders of the defined types were placed in this encounter.  Meds ordered this encounter  Medications  . DISCONTD: ezetimibe (ZETIA) 10 MG tablet    Sig: Take 1 tablet (10 mg total) by mouth daily.    Dispense:  90 tablet    Refill:  3  . ezetimibe (ZETIA) 10 MG tablet    Sig: Take 1 tablet (10 mg total) by mouth daily.    Dispense:  90 tablet    Refill:  3    Patient Instructions  Medication Instructions:  START Zetia 10 mg daily  *If you need a refill on your cardiac medications before your next appointment, please call your pharmacy*   Follow-Up: At [facility], you and your health needs are our priority.  As part of our continuing mission to provide you with exceptional heart care, we have created designated Provider Care Teams.  These Care Teams  include your primary Cardiologist (physician) and Advanced Practice Providers (APPs -  Physician Assistants and Nurse Practitioners) who all work together to provide you with the care you need, when you need it.  We recommend signing up for the patient portal called "MyChart".  Sign up information is provided on this After Visit Summary.  MyChart is used to connect with patients for Virtual Visits (Telemedicine).  Patients are able to view lab/test results, encounter notes, upcoming appointments, etc.  Non-urgent messages can be sent to your provider as well.   To learn more about what you can do with MyChart, go to [email or web address].    Your next appointment:   6 month(s)  The format for your next appointment:   In Person  Provider:   Oswaldo Milian, MD         Signed, Miguel Heinz, MD  12/13/2019 6:11 PM    [place]

## 2020-06-08 NOTE — Progress Notes (Deleted)
Cardiology Office Note:    Date:  06/08/2020   ID:  Miguel Peterson, DOB 04-Apr-1951, MRN [id number]  PCP:  Derinda Late, MD  Cardiologist:  No primary care provider on file.  Electrophysiologist:  None   Referring MD: Derinda Late, MD   No chief complaint on file.   History of Present Illness:    Miguel Peterson is a 70 y.o. male with a hx of hyperlipidemia who presents for follow-up.  He was referred by Dr. Sandi Mariscal for an evaluation of chest pain, initially seen on 09/10/2019.  Reports that starting on 3/31 he began having chest pain in the left side of his chest.  Describes as dull aching pain, 1-2 out of 10 in intensity.  Did not resolve so on 4/1 he went to his PCP where he was noted to have subtle changes on his EKG.  Chest x-ray was done which was negative.  Reports pain resolved on 4/2.  Did note some worsening in chest pain with certain movements but denies any relationship with exertion.  Reports that he walks 2-3 times per week for 30 to 45 minutes.  Has not noted exertional chest pain.  He has been on Lipitor for hyperlipidemia, but currently being held due to transaminitis.  BP elevated in clinic today, but he denies any history of hypertension.  He has been off Lipitor for about 1 month.  No smoking history.  Reports father had MI in 78s.   Coronary CTA on 09/27/2019 showed noncalcified plaque in the proximal RCA causing minimal (25 to 49%) stenosis and calcified plaque in proximal LAD and ostium of small ramus branch causing minimal (0 to 24%) stenosis.  Calcium score 14 (27th percentile).  Since last clinic visit,   he reports that he has been doing well.  He denies any further chest pain.  States he is walking 2-3 times per week for 20 to 30 minutes.  Denies any exertional chest pain or dyspnea.  Denies any lightheadedness, syncope, palpitations, lower extremity edema.     Past Medical History:  Diagnosis Date  . High cholesterol   . Hx of adenomatous colonic polyps  12/12/2018    Past Surgical History:  Procedure Laterality Date  . COLONOSCOPY    . CYST REMOVAL NECK     benign/ long time ago per pt.  . TONSILLECTOMY     as a child    Current Medications: No outpatient medications have been marked as taking for the 06/12/20 encounter (Appointment) with Donato Heinz, MD.   Current Facility-Administered Medications for the 06/12/20 encounter (Appointment) with Donato Heinz, MD  Medication  . 0.9 %  sodium chloride infusion     Allergies:   Patient has no known allergies.   Social History   Socioeconomic History  . Marital status: Married    Spouse name: Not on file  . Number of children: Not on file  . Years of education: Not on file  . Highest education level: Not on file  Occupational History  . Occupation: Designer, jewellery: [employer]  Tobacco Use  . Smoking status: Never Smoker  . Smokeless tobacco: Never Used  Vaping Use  . Vaping Use: Never used  Substance and Sexual Activity  . Alcohol use: Yes    Comment: 24 oz of beer  . Drug use: Never  . Sexual activity: Not on file  Other Topics Concern  . Not on file  Social History Narrative  . Not on file  Social Determinants of Health   Financial Resource Strain: Not on file  Food Insecurity: Not on file  Transportation Needs: Not on file  Physical Activity: Not on file  Stress: Not on file  Social Connections: Not on file     Family History: The patient's family history includes Healthy in his brother, brother, sister, and sister; Heart disease in his father.  ROS:   Please see the history of present illness.     All other systems reviewed and are negative.  EKGs/Labs/Other Studies Reviewed:    The following studies were reviewed today:   EKG:  EKG is not ordered today.  The ekg ordered at prior clinic visit demonstrates normal sinus rhythm, rate 68, nonspecific T wave flattening less than 1 mm ST depressions in leads II, 3,  aVF  Recent Labs: 09/10/2019: ALT 58; BUN 11; Creatinine, Ser 0.89; Potassium 4.4; Sodium 139  Recent Lipid Panel    Component Value Date/Time   CHOL 203 (H) 09/10/2019 1003   TRIG 127 09/10/2019 1003   HDL 50 09/10/2019 1003   CHOLHDL 4.1 09/10/2019 1003   LDLCALC 130 (H) 09/10/2019 1003    Physical Exam:    VS:  There were no vitals taken for this visit.    Wt Readings from Last 3 Encounters:  12/11/19 285 lb 9.6 oz (129.5 kg)  09/10/19 280 lb (127 kg)  12/05/18 270 lb (122.5 kg)     GEN:  Well nourished, well developed in no acute distress HEENT: Normal NECK: No JVD; No carotid bruits CARDIAC: RRR, no murmurs, rubs, gallops RESPIRATORY:  Clear to auscultation without rales, wheezing or rhonchi  ABDOMEN: Soft, non-tender, non-distended MUSCULOSKELETAL:  No edema; No deformity  SKIN: Warm and dry NEUROLOGIC:  Alert and oriented x 3 PSYCHIATRIC:  Normal affect   ASSESSMENT:    No diagnosis found. PLAN:     CAD: Presented with atypical chest pain.  Coronary CTA on 09/27/2019 showed noncalcified plaque in the proximal RCA causing minimal (25 to 49%) stenosis and calcified plaque in proximal LAD and ostium of small ramus branch causing minimal (0 to 24%) stenosis.  Calcium score 14 (27th percentile). -Has been on atorvastatin 80 mg daily but was held due to transaminitis.  Has been followed by his PCP, has repeat labs planned for later this month.  Recommended starting Zetia 10 mg daily  Hyperlipidemia: Has been on atorvastatin 80 mg daily, but held due to transaminitis.  LDL 130 on 09/10/2019 off of atorvastatin.  Recommended starting Zetia 10 mg daily  Elevated BP: 160/80 at initial clinic visit.  No history of hypertension.  Discussed starting antihypertensive, but he stated that he would like to follow-up with his PCP.  Recommended checking BP at home and bringing log to future clinic visits.  BP appears controlled today.  RTC in 6 months   Medication  Adjustments/Labs and Tests Ordered: Current medicines are reviewed at length with the patient today.  Concerns regarding medicines are outlined above.  No orders of the defined types were placed in this encounter.  No orders of the defined types were placed in this encounter.   There are no Patient Instructions on file for this visit.   Signed, Little Ishikawa, MD  06/08/2020 10:33 PM    [facility]

## 2020-06-12 ENCOUNTER — Ambulatory Visit: Payer: 59 | Admitting: Cardiology

## 2020-07-07 NOTE — Progress Notes (Signed)
Cardiology Office Note:    Date:  07/08/2020   ID:  Miguel Peterson, DOB 1950/08/08, MRN [id number]  PCP:  Derinda Late, MD  Cardiologist:  No primary care provider on file.  Electrophysiologist:  None   Referring MD: Derinda Late, MD   Chief Complaint  Patient presents with  . Follow-up    Follow-up  . Chest Pain    History of Present Illness:    Miguel Peterson is a 70 y.o. male with a hx of hyperlipidemia who presents for follow-up.  He was referred by Dr. Sandi Mariscal for an evaluation of chest pain, initially seen on 09/10/2019.  Reports that starting on 3/31 he began having chest pain in the left side of his chest.  Describes as dull aching pain, 1-2 out of 10 in intensity.  Did not resolve so on 4/1 he went to his PCP where he was noted to have subtle changes on his EKG.  Chest x-ray was done which was negative.  Reports pain resolved on 4/2.  Did note some worsening in chest pain with certain movements but denies any relationship with exertion.  Reports that he walks 2-3 times per week for 30 to 45 minutes.  Has not noted exertional chest pain.  He has been on Lipitor for hyperlipidemia, but currently being held due to transaminitis.  BP elevated in clinic today, but he denies any history of hypertension.  He has been off Lipitor for about 1 month.  No smoking history.  Reports father had MI in 57s.   Coronary CTA on 09/27/2019 showed noncalcified plaque in the proximal RCA causing minimal (25 to 49%) stenosis and calcified plaque in proximal LAD and ostium of small ramus branch causing minimal (0 to 24%) stenosis.  Calcium score 14 (27th percentile).  Since last clinic visit,  he reports that he is doing well.  He denies any further chest pain.  He denies any dyspnea, lightheadedness, syncope, palpitations, or lower extremity edema.   Past Medical History:  Diagnosis Date  . High cholesterol   . Hx of adenomatous colonic polyps 12/12/2018    Past Surgical History:  Procedure  Laterality Date  . COLONOSCOPY    . CYST REMOVAL NECK     benign/ long time ago per pt.  . TONSILLECTOMY     as a child    Current Medications: Current Meds  Medication Sig  . losartan (COZAAR) 50 MG tablet Take 1 tablet (50 mg total) by mouth daily.  . Multiple Vitamins-Minerals (CENTRUM SILVER 50+MEN PO) Take by mouth.  . Omega-3 Fatty Acids (FISH OIL) 1200 MG CAPS Take by mouth.  . simvastatin (ZOCOR) 40 MG tablet Take 40 mg by mouth daily.  . Vitamin D, Cholecalciferol, 25 MCG (1000 UT) CAPS Take 25 mcg by mouth.  . [DISCONTINUED] atorvastatin (LIPITOR) 80 MG tablet Take 80 mg by mouth daily.   Current Facility-Administered Medications for the 07/08/20 encounter (Office Visit) with Donato Heinz, MD  Medication  . 0.9 %  sodium chloride infusion     Allergies:   Patient has no known allergies.   Social History   Socioeconomic History  . Marital status: Married    Spouse name: Not on file  . Number of children: Not on file  . Years of education: Not on file  . Highest education level: Not on file  Occupational History  . Occupation: Designer, jewellery: [employer]  Tobacco Use  . Smoking status: Never Smoker  . Smokeless  tobacco: Never Used  Vaping Use  . Vaping Use: Never used  Substance and Sexual Activity  . Alcohol use: Yes    Comment: 24 oz of beer  . Drug use: Never  . Sexual activity: Not on file  Other Topics Concern  . Not on file  Social History Narrative  . Not on file   Social Determinants of Health   Financial Resource Strain: Not on file  Food Insecurity: Not on file  Transportation Needs: Not on file  Physical Activity: Not on file  Stress: Not on file  Social Connections: Not on file     Family History: The patient's family history includes Healthy in his brother, brother, sister, and sister; Heart disease in his father.  ROS:   Please see the history of present illness.     All other systems reviewed and are  negative.  EKGs/Labs/Other Studies Reviewed:    The following studies were reviewed today:   EKG:  EKG is ordered today.  The ekg ordered  demonstrates normal sinus rhythm, rate 64, nonspecific T wave flattening  Recent Labs: 09/10/2019: ALT 58; BUN 11; Creatinine, Ser 0.89; Potassium 4.4; Sodium 139  Recent Lipid Panel    Component Value Date/Time   CHOL 203 (H) 09/10/2019 1003   TRIG 127 09/10/2019 1003   HDL 50 09/10/2019 1003   CHOLHDL 4.1 09/10/2019 1003   LDLCALC 130 (H) 09/10/2019 1003    Physical Exam:    VS:  BP (!) 149/85 (BP Location: Right Arm, Patient Position: Sitting)   Pulse 64   Ht 6' (1.829 m)   Wt 270 lb (122.5 kg)   SpO2 96%   BMI 36.62 kg/m     Wt Readings from Last 3 Encounters:  07/08/20 270 lb (122.5 kg)  12/11/19 285 lb 9.6 oz (129.5 kg)  09/10/19 280 lb (127 kg)     GEN:  Well nourished, well developed in no acute distress HEENT: Normal NECK: No JVD; No carotid bruits CARDIAC: RRR, no murmurs, rubs, gallops RESPIRATORY:  Clear to auscultation without rales, wheezing or rhonchi  ABDOMEN: Soft, non-tender, non-distended MUSCULOSKELETAL:  No edema; No deformity  SKIN: Warm and dry NEUROLOGIC:  Alert and oriented x 3 PSYCHIATRIC:  Normal affect   ASSESSMENT:    1. Coronary artery disease involving native coronary artery of native heart without angina pectoris   2. Hyperlipidemia, unspecified hyperlipidemia type   3. Essential hypertension    PLAN:     CAD: Presented with atypical chest pain.  Coronary CTA on 09/27/2019 showed noncalcified plaque in the proximal RCA causing minimal (25 to 49%) stenosis and calcified plaque in proximal LAD and ostium of small ramus branch causing minimal (0 to 24%) stenosis.  Calcium score 14 (27th percentile). -Has been on atorvastatin 80 mg daily but was held due to transaminitis.    Was started on simvastatin 40 mg daily and Zetia 10 mg daily.  Will check lipid panel  Hyperlipidemia: Has been on  atorvastatin 80 mg daily, but held due to transaminitis.  LDL 130 on 09/10/2019 off of atorvastatin.  Now on simvastatin 40 mg daily and zetia 10 mg daily, will check lipid panel  Hypertension: BP elevated.  Goal BP less than 130/80.  We will start losartan 50 mg daily.  Check CMET in 1 week.  Asked patient to monitor BP daily for next 2 weeks and call with results  RTC in 6 months   Medication Adjustments/Labs and Tests Ordered: Current medicines are reviewed at  length with the patient today.  Concerns regarding medicines are outlined above.  Orders Placed This Encounter  Procedures  . Comprehensive metabolic panel  . Lipid panel  . EKG 12-Lead   Meds ordered this encounter  Medications  . losartan (COZAAR) 50 MG tablet    Sig: Take 1 tablet (50 mg total) by mouth daily.    Dispense:  90 tablet    Refill:  3    Patient Instructions  Medication Instructions:  START Losartan 50 mg daily  Please check your blood pressure at home daily, write it down.  Call the office or send message via Mychart with the readings in 2 weeks for Dr. Gardiner Rhyme to review.   *If you need a refill on your cardiac medications before your next appointment, please call your pharmacy*   Lab Work: Please return for FASTING labs in 1 week (CMET, Lipid)  Our in office lab hours are Monday-Friday 8:00-4:00, closed for lunch 12:45-1:45 pm.  No appointment needed.  Follow-Up: At [facility], you and your health needs are our priority.  As part of our continuing mission to provide you with exceptional heart care, we have created designated Provider Care Teams.  These Care Teams include your primary Cardiologist (physician) and Advanced Practice Providers (APPs -  Physician Assistants and Nurse Practitioners) who all work together to provide you with the care you need, when you need it.  We recommend signing up for the patient portal called "MyChart".  Sign up information is provided on this After Visit  Summary.  MyChart is used to connect with patients for Virtual Visits (Telemedicine).  Patients are able to view lab/test results, encounter notes, upcoming appointments, etc.  Non-urgent messages can be sent to your provider as well.   To learn more about what you can do with MyChart, go to [email or web address].    Your next appointment:   6 month(s)  The format for your next appointment:   In Person  Provider:   Oswaldo Milian, MD      Signed, Donato Heinz, MD  07/08/2020 11:12 AM    [place]

## 2020-07-08 ENCOUNTER — Other Ambulatory Visit: Payer: Self-pay

## 2020-07-08 ENCOUNTER — Ambulatory Visit: Payer: Medicare PPO | Admitting: Cardiology

## 2020-07-08 VITALS — BP 149/85 | HR 64 | Ht 72.0 in | Wt 270.0 lb

## 2020-07-08 DIAGNOSIS — I2510 Atherosclerotic heart disease of native coronary artery without angina pectoris: Secondary | ICD-10-CM

## 2020-07-08 DIAGNOSIS — I10 Essential (primary) hypertension: Secondary | ICD-10-CM

## 2020-07-08 DIAGNOSIS — E785 Hyperlipidemia, unspecified: Secondary | ICD-10-CM

## 2020-07-08 MED ORDER — LOSARTAN POTASSIUM 50 MG PO TABS: 50.0000 mg | ORAL_TABLET | Freq: Every day | ORAL | 3 refills | Status: AC

## 2020-07-08 NOTE — Patient Instructions (Signed)
Medication Instructions:  START Losartan 50 mg daily  Please check your blood pressure at home daily, write it down.  Call the office or send message via Mychart with the readings in 2 weeks for Dr. Gardiner Rhyme to review.   *If you need a refill on your cardiac medications before your next appointment, please call your pharmacy*   Lab Work: Please return for FASTING labs in 1 week (CMET, Lipid)  Our in office lab hours are Monday-Friday 8:00-4:00, closed for lunch 12:45-1:45 pm.  No appointment needed.  Follow-Up: At [facility], you and your health needs are our priority.  As part of our continuing mission to provide you with exceptional heart care, we have created designated Provider Care Teams.  These Care Teams include your primary Cardiologist (physician) and Advanced Practice Providers (APPs -  Physician Assistants and Nurse Practitioners) who all work together to provide you with the care you need, when you need it.  We recommend signing up for the patient portal called "MyChart".  Sign up information is provided on this After Visit Summary.  MyChart is used to connect with patients for Virtual Visits (Telemedicine).  Patients are able to view lab/test results, encounter notes, upcoming appointments, etc.  Non-urgent messages can be sent to your provider as well.   To learn more about what you can do with MyChart, go to [email or web address].    Your next appointment:   6 month(s)  The format for your next appointment:   In Person  Provider:   Oswaldo Milian, MD

## 2020-07-18 LAB — COMPREHENSIVE METABOLIC PANEL
ALT: 68 IU/L — ABNORMAL HIGH (ref 0–44)
AST: 45 IU/L — ABNORMAL HIGH (ref 0–40)
Albumin/Globulin Ratio: 1.9 (ref 1.2–2.2)
Albumin: 4.3 g/dL (ref 3.8–4.8)
Alkaline Phosphatase: 119 IU/L (ref 44–121)
BUN/Creatinine Ratio: 17 (ref 10–24)
BUN: 18 mg/dL (ref 8–27)
Bilirubin Total: 0.8 mg/dL (ref 0.0–1.2)
CO2: 22 mmol/L (ref 20–29)
Calcium: 9.1 mg/dL (ref 8.6–10.2)
Chloride: 106 mmol/L (ref 96–106)
Creatinine, Ser: 1.07 mg/dL (ref 0.76–1.27)
GFR calc Af Amer: 82 mL/min/{1.73_m2} (ref >59)
GFR calc non Af Amer: 71 mL/min/{1.73_m2} (ref >59)
Globulin, Total: 2.3 g/dL (ref 1.5–4.5)
Glucose: 111 mg/dL — ABNORMAL HIGH (ref 65–99)
Potassium: 4.7 mmol/L (ref 3.5–5.2)
Sodium: 141 mmol/L (ref 134–144)
Total Protein: 6.6 g/dL (ref 6.0–8.5)

## 2020-07-18 LAB — LIPID PANEL
Chol/HDL Ratio: 2.2 ratio (ref 0.0–5.0)
Cholesterol, Total: 108 mg/dL (ref 100–199)
HDL: 49 mg/dL (ref >39)
LDL Chol Calc (NIH): 43 mg/dL (ref 0–99)
Triglycerides: 82 mg/dL (ref 0–149)
VLDL Cholesterol Cal: 16 mg/dL (ref 5–40)

## 2021-06-10 ENCOUNTER — Other Ambulatory Visit: Payer: Self-pay | Admitting: Family Medicine

## 2021-06-15 LAB — SARS CORONAVIRUS 2 (TAT 6-24 HRS): SARS Coronavirus 2: POSITIVE — AB

## 2021-06-25 ENCOUNTER — Other Ambulatory Visit: Payer: Self-pay

## 2021-06-25 ENCOUNTER — Other Ambulatory Visit: Payer: Self-pay | Admitting: Family Medicine

## 2021-06-25 ENCOUNTER — Ambulatory Visit
Admission: RE | Admit: 2021-06-25 | Discharge: 2021-06-25 | Disposition: A | Discharge status: Home / Self Care | Payer: Medicare PPO | Source: Ambulatory Visit | Attending: Family Medicine | Admitting: Family Medicine

## 2021-06-25 DIAGNOSIS — M25511 Pain in right shoulder: Secondary | ICD-10-CM

## 2022-01-26 IMAGING — CR DG SHOULDER 2+V*R*
4 series · 4 of 4 positions shown · non-contrast
Comparison: None.

CLINICAL DATA: Right shoulder pain for 1-2 weeks.

EXAM:
RIGHT SHOULDER - 2+ VIEW

[w shoulder grashey right]
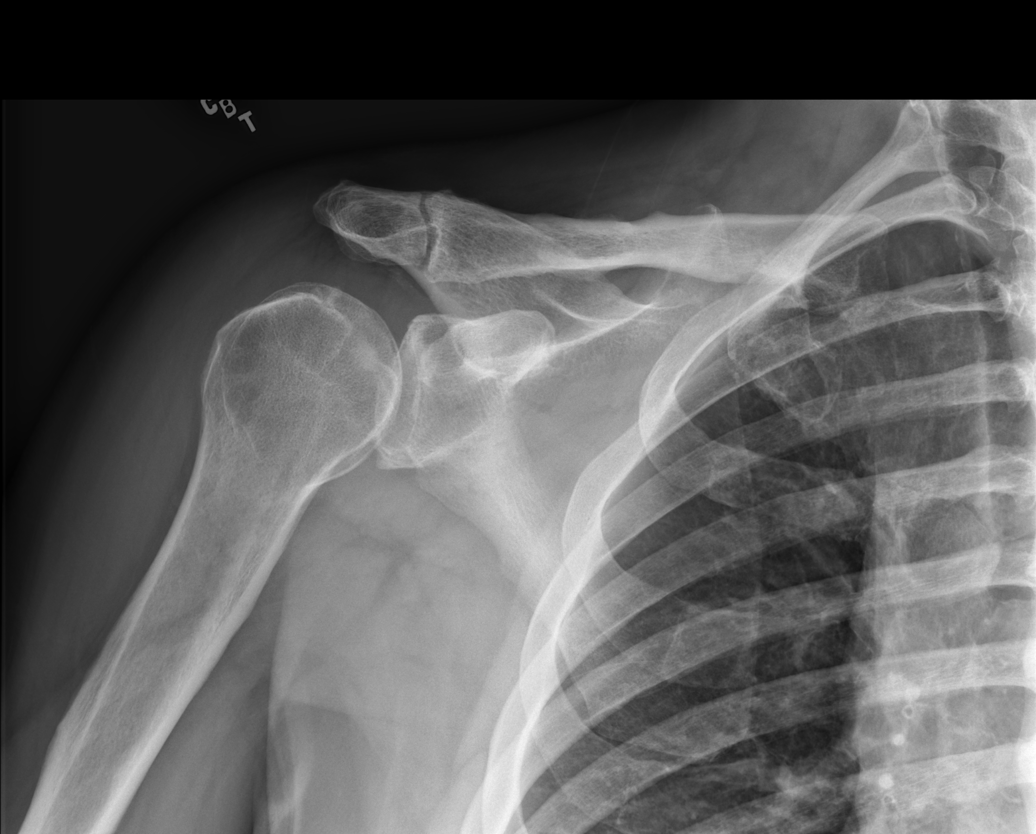

[w shoulder y-view right (1 of 2)]
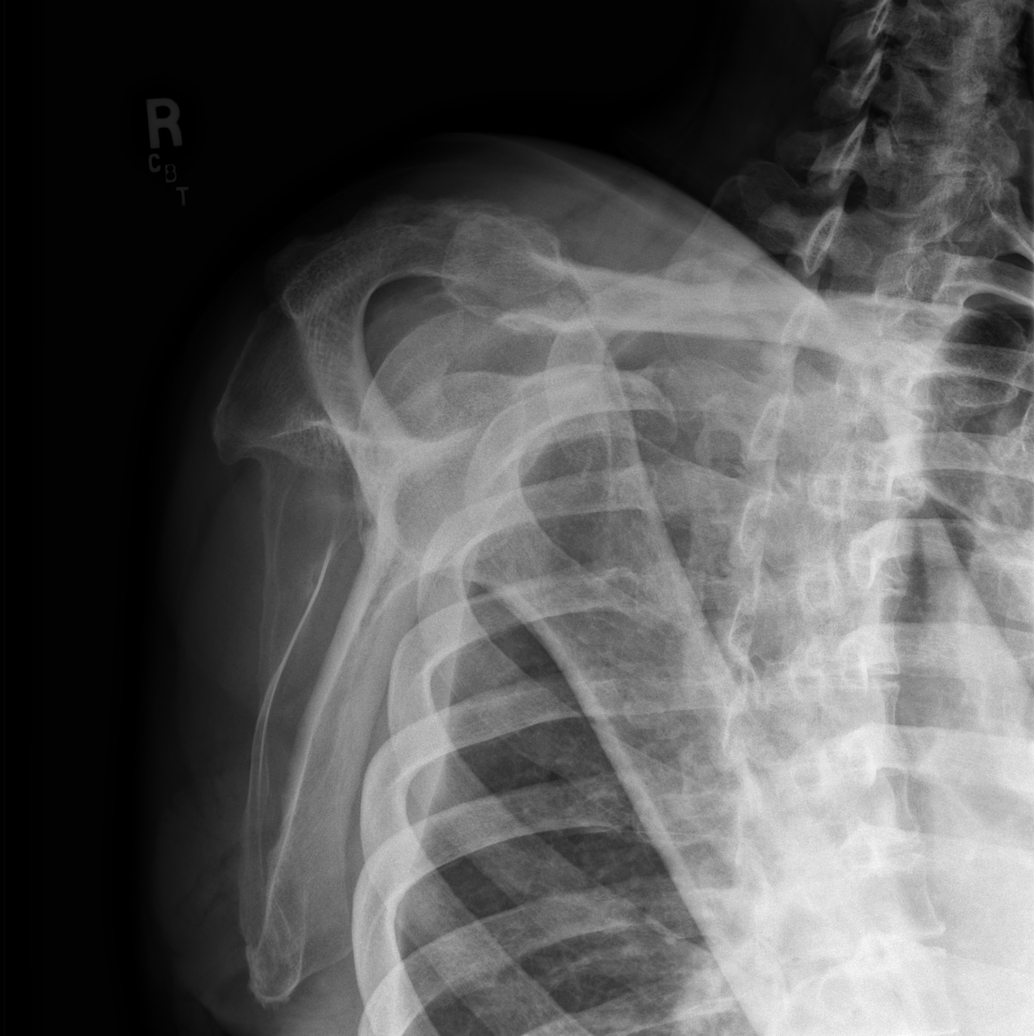

[w shoulder y-view right (2 of 2)]
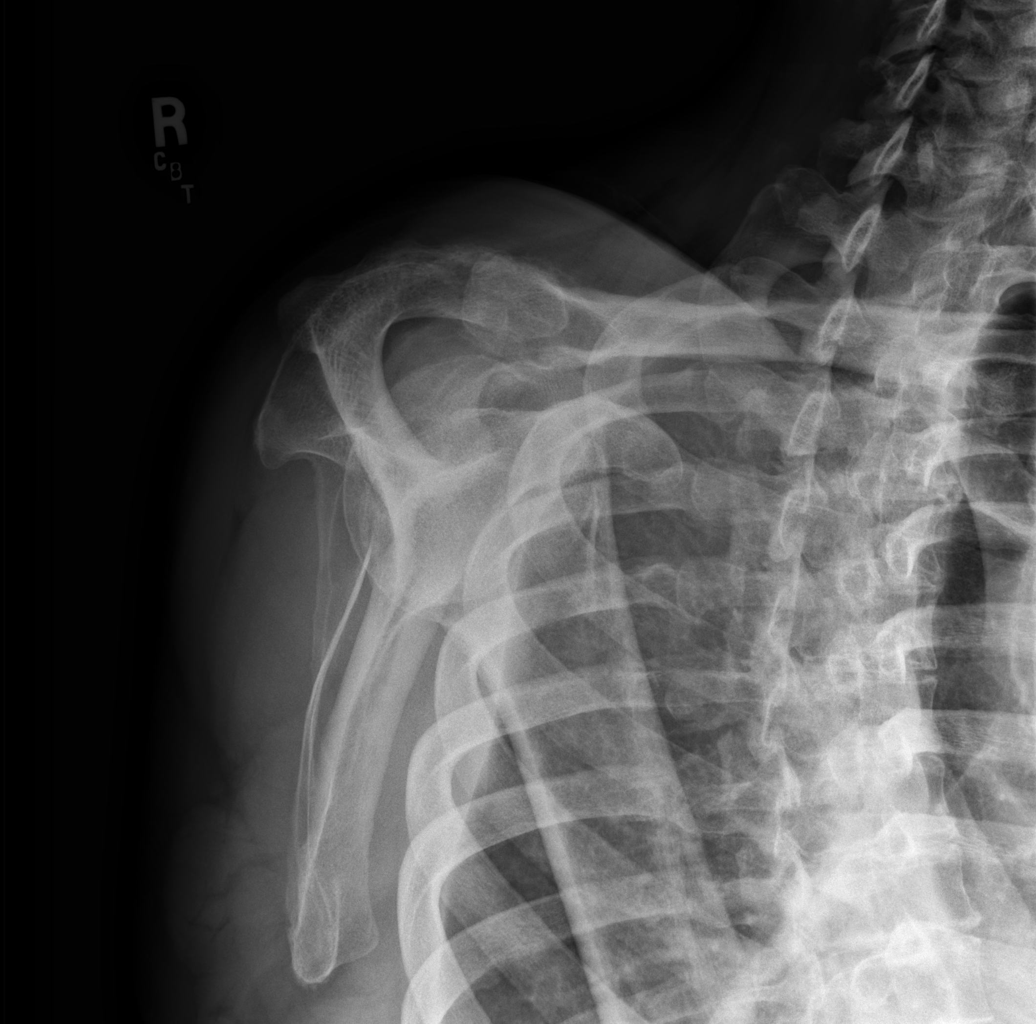

[x shoulder axillary right]
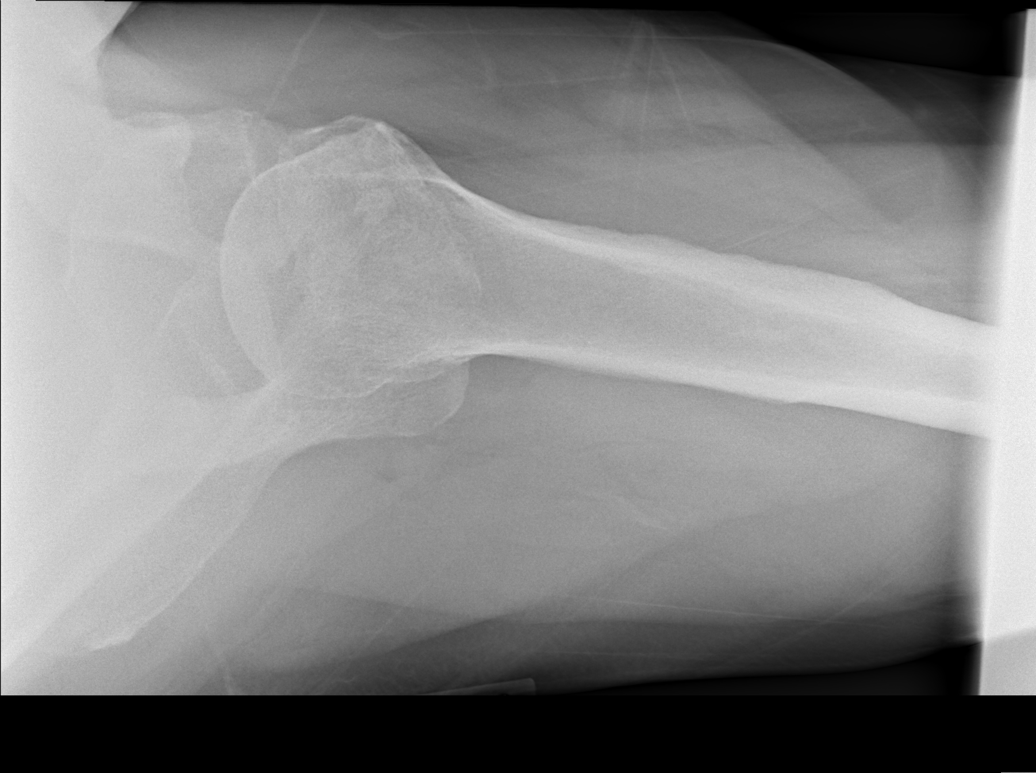

[4 of 4 positions shown; findings below may reference images not displayed]

FINDINGS: Severe right acromioclavicular joint space narrowing with
mild-to-moderate peripheral degenerative osteophytosis. Mild
glenohumeral joint space narrowing and with mild peripheral glenoid
degenerative osteophytosis.

No acute fracture is seen. No dislocation. The visualized portion of
the right lung is unremarkable.
IMPRESSION: Moderate acromioclavicular and mild glenohumeral osteoarthritis.

## 2022-05-06 ENCOUNTER — Other Ambulatory Visit: Payer: Self-pay | Admitting: Family Medicine

## 2022-05-06 ENCOUNTER — Ambulatory Visit
Admission: RE | Admit: 2022-05-06 | Discharge: 2022-05-06 | Disposition: A | Discharge status: Home / Self Care | Payer: Medicare PPO | Source: Ambulatory Visit | Attending: Family Medicine | Admitting: Family Medicine

## 2022-05-06 DIAGNOSIS — R059 Cough, unspecified: Secondary | ICD-10-CM

## 2022-05-13 LAB — COMPREHENSIVE METABOLIC PANEL
Albumin: 4.2 (ref 3.5–5.0)
Calcium: 9.1 (ref 8.7–10.7)
Globulin: 2

## 2022-05-13 LAB — BASIC METABOLIC PANEL
BUN: 17 (ref 4–21)
CO2: 24 — AB (ref 13–22)
Chloride: 106 (ref 99–108)
Creatinine: 1.1 (ref 0.6–1.3)
Glucose: 111
Potassium: 4.2 mEq/L (ref 3.5–5.1)
Sodium: 142 (ref 137–147)

## 2022-05-13 LAB — CBC: RBC: 4.87 (ref 3.87–5.11)

## 2022-05-13 LAB — PSA: PSA: 1.53

## 2022-05-13 LAB — VITAMIN D 25 HYDROXY (VIT D DEFICIENCY, FRACTURES): Vit D, 25-Hydroxy: 27.3

## 2022-05-13 LAB — LIPID PANEL
Cholesterol: 106 (ref 0–200)
HDL: 64 (ref 35–70)
LDL Cholesterol: 30
Triglycerides: 50 (ref 40–160)

## 2022-05-13 LAB — HEPATIC FUNCTION PANEL
ALT: 32 U/L (ref 10–40)
AST: 25 (ref 14–40)
Alkaline Phosphatase: 99 (ref 25–125)
Bilirubin, Total: 0.4

## 2022-05-13 LAB — CBC AND DIFFERENTIAL
HCT: 44 (ref 41–53)
Hemoglobin: 14.7 (ref 13.5–17.5)
Neutrophils Absolute: 2.76
Platelets: 216 K/uL (ref 150–400)
WBC: 4.9

## 2022-05-13 LAB — TSH: TSH: 3.66 (ref 0.41–5.90)

## 2022-05-13 LAB — HEMOGLOBIN A1C: Hemoglobin A1C: 6

## 2022-06-09 ENCOUNTER — Encounter (INDEPENDENT_AMBULATORY_CARE_PROVIDER_SITE_OTHER): Payer: Medicare PPO | Admitting: Family Medicine

## 2022-06-09 DIAGNOSIS — Z0289 Encounter for other administrative examinations: Secondary | ICD-10-CM

## 2022-06-24 ENCOUNTER — Other Ambulatory Visit (INDEPENDENT_AMBULATORY_CARE_PROVIDER_SITE_OTHER): Payer: Self-pay

## 2022-07-06 ENCOUNTER — Ambulatory Visit (INDEPENDENT_AMBULATORY_CARE_PROVIDER_SITE_OTHER): Payer: Medicare PPO | Admitting: Family Medicine

## 2022-07-06 ENCOUNTER — Encounter (INDEPENDENT_AMBULATORY_CARE_PROVIDER_SITE_OTHER): Payer: Self-pay | Admitting: Family Medicine

## 2022-07-06 VITALS — BP 131/76 | HR 76 | Temp 98.3°F | Ht 72.0 in

## 2022-07-06 DIAGNOSIS — R5383 Other fatigue: Secondary | ICD-10-CM | POA: Diagnosis not present

## 2022-07-06 DIAGNOSIS — E7800 Pure hypercholesterolemia, unspecified: Secondary | ICD-10-CM

## 2022-07-06 DIAGNOSIS — R739 Hyperglycemia, unspecified: Secondary | ICD-10-CM

## 2022-07-06 DIAGNOSIS — R918 Other nonspecific abnormal finding of lung field: Secondary | ICD-10-CM

## 2022-07-06 DIAGNOSIS — R7401 Elevation of levels of liver transaminase levels: Secondary | ICD-10-CM | POA: Diagnosis not present

## 2022-07-06 DIAGNOSIS — E559 Vitamin D deficiency, unspecified: Secondary | ICD-10-CM

## 2022-07-06 DIAGNOSIS — E669 Obesity, unspecified: Secondary | ICD-10-CM

## 2022-07-06 DIAGNOSIS — R0602 Shortness of breath: Secondary | ICD-10-CM

## 2022-07-06 DIAGNOSIS — I10 Essential (primary) hypertension: Secondary | ICD-10-CM

## 2022-07-06 DIAGNOSIS — Z1331 Encounter for screening for depression: Secondary | ICD-10-CM

## 2022-07-06 DIAGNOSIS — Z6835 Body mass index (BMI) 35.0-35.9, adult: Secondary | ICD-10-CM

## 2022-07-07 LAB — VITAMIN D 25 HYDROXY (VIT D DEFICIENCY, FRACTURES): Vit D, 25-Hydroxy: 42.9 ng/mL (ref 30.0–100.0)

## 2022-07-07 LAB — INSULIN, RANDOM: INSULIN: 11 u[IU]/mL (ref 2.6–24.9)

## 2022-07-07 LAB — T3: T3, Total: 132 ng/dL (ref 71–180)

## 2022-07-07 LAB — T4, FREE: Free T4: 1.29 ng/dL (ref 0.82–1.77)

## 2022-07-15 NOTE — Progress Notes (Signed)
Chief Complaint:   OBESITY Miguel Peterson (MR# [id number]) is a 72 y.o. male who presents for evaluation and treatment of obesity and related comorbidities. Current BMI is Body mass index is 36.62 kg/m. Miguel Peterson has been struggling with his weight for many years and has been unsuccessful in either losing weight, maintaining weight loss, or reaching his healthy weight goal.  Miguel Peterson referred by Dr. Sandi Mariscal.  Oatmeal in the a.m. with chia seeds and blueberries or peaches and bananas (2 cups) plus water, (satisfied) or raisin bran (1/2 cup) with almond milk, (satisfied).  If he eats lunch, 2 - 3 PM, salad with carrots and dressing or sandwich with meat 2 slices and cheddar cheese 2 slices either with water, (satisfied).  5 to 6:30 PM protein (4 ounces) vegetables (1/2 cup) and salad (1 cup) (satisfied).  Snack occasionally-eating out of a bag, handful.  Miguel Peterson is currently in the action stage of change and ready to dedicate time achieving and maintaining a healthier weight. Miguel Peterson is interested in becoming our patient and working on intensive lifestyle modifications including (but not limited to) diet and exercise for weight loss.  Miguel Peterson's habits were reviewed today and are as follows: His family eats meals together, he thinks his family will eat healthier with him, his desired weight loss is 77 pounds, he started gaining weight in his 30s, his heaviest weight ever was 277 pounds, he has significant food cravings issues, he snacks frequently in the evenings, and he skips meals frequently.  Depression Screen Miguel Peterson's Food and Mood (modified PHQ-9) score was 1.      No data to display         Subjective:   1. Other fatigue Miguel Peterson admits to daytime somnolence and reports waking up still tired. Patient has a history of symptoms of daytime fatigue and morning fatigue. Miguel Peterson generally gets 7 or 8 hours of sleep per night, and states that he has generally restful sleep. Snoring is present. Apneic  episodes are not present. Epworth Sleepiness Score is 4.    2. SOBOE (shortness of breath on exertion) Miguel Peterson notes increasing shortness of breath with exercising and seems to be worsening over time with weight gain. He notes getting out of breath sooner with activity than he used to. This has not gotten worse recently. Miguel Peterson denies shortness of breath at rest or orthopnea.   3. Hyperglycemia Last A1c was 6.0.  Not on medications.  4. Transaminitis Elevated LFTs 1 to 2 years ago.  Normal on last check.  5. Primary hypertension On losartan.  Blood pressure control today.  EKG-NSR.  6. Pure hypercholesterolemia On simvastatin and Zetia.  7. Pulmonary nodules Seen on CT coronary calcium.  8. Vitamin D deficiency Last vitamin D level of 27.3.  Assessment/Plan:   1. Other fatigue We will obtain labs today.  Miguel Peterson does feel that his weight is causing his energy to be lower than it should be. Fatigue may be related to obesity, depression or many other causes. Labs will be ordered, and in the meanwhile, Miguel Peterson will focus on self care including making healthy food choices, increasing physical activity and focusing on stress reduction.   - EKG 12-Lead - T4, free - T3  2. SOBOE (shortness of breath on exertion) Miguel Peterson does feel that he gets out of breath more easily that he used to when he exercises. Miguel Peterson's shortness of breath appears to be obesity related and exercise induced. He has agreed to work on weight loss and  gradually increase exercise to treat his exercise induced shortness of breath. Will continue to monitor closely.   3. Hyperglycemia We will obtain labs today.  - Insulin, random  4. Transaminitis Will obtain CMP in 1 to 2 months.  5. Primary hypertension EKG, CMP.  6. Pure hypercholesterolemia Will continue with current medications.  7. Pulmonary nodules Patient encouraged to reach out to PCP for further guidance/management.  8. Vitamin D deficiency We will  obtain labs today.  - VITAMIN D 25 Hydroxy (Vit-D Deficiency, Fractures)  9. Depression screening Behavior modification techniques were discussed today to help Navin deal with his emotional/non-hunger eating behaviors.  Orders and follow up as documented in patient record.    10. BMI 35.0-35.9,adult  11. Obesity with starting BMI of 37.1 [place] is currently in the action stage of change and his goal is to continue with weight loss efforts. I recommend Miguel Peterson begin the structured treatment plan as follows:  He has agreed to the Category 4 Plan.  Exercise goals: No exercise has been prescribed at this time.   Behavioral modification strategies: increasing lean protein intake, meal planning and cooking strategies, keeping healthy foods in the home, and planning for success.  He was informed of the importance of frequent follow-up visits to maximize his success with intensive lifestyle modifications for his multiple health conditions. He was informed we would discuss his lab results at his next visit unless there is a critical issue that needs to be addressed sooner. Miguel Peterson agreed to keep his next visit at the agreed upon time to discuss these results.  Objective:   Blood pressure 131/76, pulse 76, temperature 98.3 F (36.8 C), height 6' (1.829 m), SpO2 100 %. Body mass index is 36.62 kg/m.  EKG: Normal sinus rhythm, rate 74 bpm.  Indirect Calorimeter completed today shows a VO2 of 320 ml and a REE of 2203.  His calculated basal metabolic rate is [id number] thus his basal metabolic rate is worse than expected.  General: Cooperative, alert, well developed, in no acute distress. HEENT: Conjunctivae and lids unremarkable. Cardiovascular: Regular rhythm.  Lungs: Normal work of breathing. Neurologic: No focal deficits.   Lab Results  Component Value Date   CREATININE 1.1 05/13/2022   BUN 17 05/13/2022   NA 142 05/13/2022   K 4.2 05/13/2022   CL 106 05/13/2022   CO2 24 (A) 05/13/2022   Lab  Results  Component Value Date   ALT 32 05/13/2022   AST 25 05/13/2022   ALKPHOS 99 05/13/2022   BILITOT 0.8 07/17/2020   Lab Results  Component Value Date   HGBA1C 6.0 05/13/2022   Lab Results  Component Value Date   INSULIN 11.0 07/06/2022   Lab Results  Component Value Date   TSH 3.66 05/13/2022   Lab Results  Component Value Date   CHOL 106 05/13/2022   HDL 64 05/13/2022   LDLCALC 30 05/13/2022   TRIG 50 05/13/2022   CHOLHDL 2.2 07/17/2020   Lab Results  Component Value Date   WBC 4.9 05/13/2022   HGB 14.7 05/13/2022   HCT 44 05/13/2022   PLT 216 05/13/2022   No results found for: "IRON", "TIBC", "FERRITIN"  Attestation Statements:   Reviewed by clinician on day of visit: allergies, medications, problem list, medical history, surgical history, family history, social history, and previous encounter notes.  Time spent on visit including pre-visit chart review and post-visit charting and care was 40 minutes.   I, Elnora Morrison, RMA am acting as Location manager for [place]  Jearld Shines, MD.  This is the patient's first visit at [facility]. The patient's NEW PATIENT PACKET was reviewed at length. Included in the packet: current and past health history, medications, allergies, ROS, gynecologic history (women only), surgical history, family history, social history, weight history, weight loss surgery history (for those that have had weight loss surgery), nutritional evaluation, mood and food questionnaire, PHQ9, Epworth questionnaire, sleep habits questionnaire, patient life and health improvement goals questionnaire. These will all be scanned into the patient's chart under media.   During the visit, I independently reviewed the patient's EKG, bioimpedance scale results, and indirect calorimeter results. I used this information to tailor a meal plan for the patient that will help him to lose weight and will improve his obesity-related conditions going forward.  I performed a medically necessary appropriate examination and/or evaluation. I discussed the assessment and treatment plan with the patient. The patient was provided an opportunity to ask questions and all were answered. The patient agreed with the plan and demonstrated an understanding of the instructions. Labs were ordered at this visit and will be reviewed at the next visit unless more critical results need to be addressed immediately. Clinical information was updated and documented in the EMR.    I have reviewed the above documentation for accuracy and completeness, and I agree with the above. - [name], MD

## 2022-07-20 ENCOUNTER — Encounter (INDEPENDENT_AMBULATORY_CARE_PROVIDER_SITE_OTHER): Payer: Self-pay | Admitting: Family Medicine

## 2022-07-20 ENCOUNTER — Ambulatory Visit (INDEPENDENT_AMBULATORY_CARE_PROVIDER_SITE_OTHER): Payer: Medicare PPO | Admitting: Family Medicine

## 2022-07-20 VITALS — BP 119/72 | HR 76 | Temp 98.5°F | Ht 72.0 in

## 2022-07-20 DIAGNOSIS — I10 Essential (primary) hypertension: Secondary | ICD-10-CM | POA: Diagnosis not present

## 2022-07-20 DIAGNOSIS — R7303 Prediabetes: Secondary | ICD-10-CM

## 2022-07-20 DIAGNOSIS — E559 Vitamin D deficiency, unspecified: Secondary | ICD-10-CM | POA: Diagnosis not present

## 2022-07-20 DIAGNOSIS — I2510 Atherosclerotic heart disease of native coronary artery without angina pectoris: Secondary | ICD-10-CM | POA: Diagnosis not present

## 2022-07-20 DIAGNOSIS — Z6836 Body mass index (BMI) 36.0-36.9, adult: Secondary | ICD-10-CM

## 2022-07-20 DIAGNOSIS — E669 Obesity, unspecified: Secondary | ICD-10-CM

## 2022-07-20 NOTE — Progress Notes (Deleted)
First two weeks he did not find the meal plan easy in terms of the repetition and monotony of the food choices.  Wife asked about high protein meat substitutes.  Sandwich at lunch has been somewhat difficult to get down.  Wondering about incorporation of oatmeal during the day.  Asked about water intake and how to incorporate beer.  Wonders about changing meal plan and eating out.  Going out of town in March for 10 days and April for 15 days.  April trip is a cruise.

## 2022-07-29 NOTE — Progress Notes (Signed)
Chief Complaint:   OBESITY Miguel Peterson is here to discuss his progress with his obesity treatment plan along with follow-up of his obesity related diagnoses. Miguel Peterson is on the Category 4 Plan and states he is following his eating plan approximately 90% of the time. Miguel Peterson states he is walking the dog 7 times per week.  Today's visit was #: 2 Starting weight: 273 lbs Starting date: 07/06/2022 Today's weight: 267 lbs Today's date: 07/06/2022 Total lbs lost to date: 6 lbs Total lbs lost since last in-office visit: 6  Interim History: First two weeks Miguel Peterson did not find the meal plan easy in terms of the repetition and monotony of the food choices.  Wife asked about high protein meat substitutes.  Sandwich at lunch has been somewhat difficult to get down.  Wondering about incorporation of oatmeal during the day.  Asked about water intake and how to incorporate beer.  Wonders about changing meal plan and eating out.  Going out of town in March for 10 days and April for 15 days.  April trip is a cruise.  Subjective:   1. Essential hypertension Blood pressure controlled today.  Denies chest pain, chest pressure and headache.  2. Coronary artery disease, unspecified vessel or lesion type, unspecified whether angina present, unspecified whether native or transplanted heart Labs discussed during visit today.  On Zocor 40 mg daily and Zetia 10 mg daily no side effects noted.  Discussed labs done on 05/13/2022.  3. Vitamin D deficiency Taking 1K IU/daily.  Notes fatigue.  Vitamin D level of 42.9.  4. Prediabetes A1c in December 2023 of 6.0.  Not on medications.  No new diagnosis.  Assessment/Plan:   1. Essential hypertension Will continue Cozaar.  2. Coronary artery disease, unspecified vessel or lesion type, unspecified whether angina present, unspecified whether native or transplanted heart Will continue Zocor.  3. Vitamin D deficiency Continue over-the-counter vitamin D; will repeat labs in 3  months.  4. Prediabetes Follow-up with labs in 3 months without changes in treatment.  5. BMI 36.0-36.9,adult  6. Obesity with starting BMI of 37.1 Miguel Peterson is currently in the action stage of change. As such, his goal is to continue with weight loss efforts. He has agreed to the Category 4 Plan.   Exercise goals: Older adults should follow the adult guidelines. When older adults cannot meet the adult guidelines, they should be as physically active as their abilities and conditions will allow.   Behavioral modification strategies: increasing lean protein intake, meal planning and cooking strategies, keeping healthy foods in the home, and planning for success.  Miguel Peterson has agreed to follow-up with our clinic in 2 weeks. He was informed of the importance of frequent follow-up visits to maximize his success with intensive lifestyle modifications for his multiple health conditions.   Objective:   Blood pressure 119/72, pulse 76, temperature 98.5 F (36.9 C), height 6' (1.829 m), SpO2 97 %. Body mass index is 36.62 kg/m.  General: Cooperative, alert, well developed, in no acute distress. HEENT: Conjunctivae and lids unremarkable. Cardiovascular: Regular rhythm.  Lungs: Normal work of breathing. Neurologic: No focal deficits.   Lab Results  Component Value Date   CREATININE 1.1 05/13/2022   BUN 17 05/13/2022   NA 142 05/13/2022   K 4.2 05/13/2022   CL 106 05/13/2022   CO2 24 (A) 05/13/2022   Lab Results  Component Value Date   ALT 32 05/13/2022   AST 25 05/13/2022   ALKPHOS 99 05/13/2022   BILITOT  0.8 07/17/2020   Lab Results  Component Value Date   HGBA1C 6.0 05/13/2022   Lab Results  Component Value Date   INSULIN 11.0 07/06/2022   Lab Results  Component Value Date   TSH 3.66 05/13/2022   Lab Results  Component Value Date   CHOL 106 05/13/2022   HDL 64 05/13/2022   LDLCALC 30 05/13/2022   TRIG 50 05/13/2022   CHOLHDL 2.2 07/17/2020   Lab Results  Component  Value Date   VD25OH 42.9 07/06/2022   VD25OH 27.3 05/13/2022   Lab Results  Component Value Date   WBC 4.9 05/13/2022   HGB 14.7 05/13/2022   HCT 44 05/13/2022   PLT 216 05/13/2022   No results found for: "IRON", "TIBC", "FERRITIN"  Attestation Statements:   Reviewed by clinician on day of visit: allergies, medications, problem list, medical history, surgical history, family history, social history, and previous encounter notes.  Time spent on visit including pre-visit chart review and post-visit care and charting was 45 minutes.   I, Elnora Morrison, RMA am acting as transcriptionist for [name], MD.  I have reviewed the above documentation for accuracy and completeness, and I agree with the above. - [name], MD

## 2022-08-05 ENCOUNTER — Ambulatory Visit (INDEPENDENT_AMBULATORY_CARE_PROVIDER_SITE_OTHER): Payer: Medicare PPO | Admitting: Family Medicine

## 2022-08-05 ENCOUNTER — Ambulatory Visit: Payer: Medicare PPO | Attending: Cardiology | Admitting: Cardiology

## 2022-08-05 ENCOUNTER — Encounter (INDEPENDENT_AMBULATORY_CARE_PROVIDER_SITE_OTHER): Payer: Self-pay | Admitting: Family Medicine

## 2022-08-05 ENCOUNTER — Encounter: Payer: Self-pay | Admitting: Cardiology

## 2022-08-05 VITALS — BP 110/66 | HR 59 | Temp 97.5°F | Ht 72.0 in | Wt 265.0 lb

## 2022-08-05 VITALS — BP 108/64 | HR 61 | Ht 72.0 in | Wt 270.4 lb

## 2022-08-05 DIAGNOSIS — E669 Obesity, unspecified: Secondary | ICD-10-CM | POA: Diagnosis not present

## 2022-08-05 DIAGNOSIS — E7849 Other hyperlipidemia: Secondary | ICD-10-CM

## 2022-08-05 DIAGNOSIS — R7303 Prediabetes: Secondary | ICD-10-CM | POA: Diagnosis not present

## 2022-08-05 DIAGNOSIS — Z6836 Body mass index (BMI) 36.0-36.9, adult: Secondary | ICD-10-CM | POA: Diagnosis not present

## 2022-08-05 DIAGNOSIS — I10 Essential (primary) hypertension: Secondary | ICD-10-CM | POA: Diagnosis not present

## 2022-08-05 DIAGNOSIS — I2510 Atherosclerotic heart disease of native coronary artery without angina pectoris: Secondary | ICD-10-CM

## 2022-08-05 DIAGNOSIS — E785 Hyperlipidemia, unspecified: Secondary | ICD-10-CM

## 2022-08-05 DIAGNOSIS — R079 Chest pain, unspecified: Secondary | ICD-10-CM

## 2022-08-05 NOTE — Progress Notes (Signed)
Chief Complaint:   OBESITY Miguel Peterson is here to discuss his progress with his obesity treatment plan along with follow-up of his obesity related diagnoses. [name] is on the Category 4 Plan and states he is following his eating plan approximately 85% of the time. [name] states he is walking 1/4 miles 5 times per week.  Today's visit was #: 3 Starting weight: 273 lbs Starting date: 07/06/2022 Today's weight: 265 lbs Today's date: 08/05/2022 Total lbs lost to date: 8 Total lbs lost since last in-office visit: 2  Interim History: Lat few weeks patient has been mostly at home.  Tomorrow he is traveling to [place] to visit family.  He will be gone for 1 week.  Food wise he has been following Category 4 but not as strictly as he did previously.  He is eating food off the plan but isn't eating all the food on the list all the time.  He occasionally skips meals or combines meals.  Redundancy of food is making following plan a bit arduous.  Subjective:   1. Other hyperlipidemia [name] has an LDL of 30, HDL 64, and is on Zetia and Zocor.  2. Prediabetes [name] last A1c was 6.0 with an insulin level of 11, and he is not on meds.  Assessment/Plan:   1. Other hyperlipidemia Cardiovascular risk and specific lipid/LDL goals reviewed.  We discussed several lifestyle modifications today and Han will continue to work on diet, exercise and weight loss efforts. Orders and follow up as documented in patient record.  Continue current treatment plan.  Counseling Intensive lifestyle modifications are the first line treatment for this issue. Dietary changes: Increase soluble fiber. Decrease simple carbohydrates. Exercise changes: Moderate to vigorous-intensity aerobic activity 150 minutes per week if tolerated. Lipid-lowering medications: see documented in medical record.  2. Prediabetes Miguel Peterson will continue to work on weight loss, exercise, and decreasing simple carbohydrates to help decrease the risk of  diabetes.  Repeat labs in April/May.  3. BMI 36.0-36.9,adult 4. Obesity with starting BMI of 37.1 Miguel Peterson is currently in the action stage of change. As such, his goal is to continue with weight loss efforts. He has agreed to the Category 4 Plan and keeping a food journal and adhering to recommended goals of 350-500 calories and 30+ grams protein with breakfast and 450-600 calories and 40+ grams protein with lunch.   Exercise goals: All adults should avoid inactivity. Some physical activity is better than none, and adults who participate in any amount of physical activity gain some health benefits.  Behavioral modification strategies: increasing lean protein intake, meal planning and cooking strategies, keeping healthy foods in the home, and planning for success.  [name] has agreed to follow-up with our clinic in 2-3 weeks. He was informed of the importance of frequent follow-up visits to maximize his success with intensive lifestyle modifications for his multiple health conditions.   Objective:   Blood pressure 110/66, pulse (!) 59, temperature (!) 97.5 F (36.4 C), height 6' (1.829 m), weight 265 lb (120.2 kg), SpO2 98 %. Body mass index is 35.94 kg/m.  General: Cooperative, alert, well developed, in no acute distress. HEENT: Conjunctivae and lids unremarkable. Cardiovascular: Regular rhythm.  Lungs: Normal work of breathing. Neurologic: No focal deficits.   Lab Results  Component Value Date   CREATININE 1.1 05/13/2022   BUN 17 05/13/2022   NA 142 05/13/2022   K 4.2 05/13/2022   CL 106 05/13/2022   CO2 24 (A) 05/13/2022   Lab Results  Component Value Date   ALT 32 05/13/2022   AST 25 05/13/2022   ALKPHOS 99 05/13/2022   BILITOT 0.8 07/17/2020   Lab Results  Component Value Date   HGBA1C 6.0 05/13/2022   Lab Results  Component Value Date   INSULIN 11.0 07/06/2022   Lab Results  Component Value Date   TSH 3.66 05/13/2022   Lab Results  Component Value Date   CHOL  106 05/13/2022   HDL 64 05/13/2022   LDLCALC 30 05/13/2022   TRIG 50 05/13/2022   CHOLHDL 2.2 07/17/2020   Lab Results  Component Value Date   VD25OH 42.9 07/06/2022   VD25OH 27.3 05/13/2022   Lab Results  Component Value Date   WBC 4.9 05/13/2022   HGB 14.7 05/13/2022   HCT 44 05/13/2022   PLT 216 05/13/2022    Attestation Statements:   Reviewed by clinician on day of visit: allergies, medications, problem list, medical history, surgical history, family history, social history, and previous encounter notes.  I, Kathlene November, BS, CMA, am acting as transcriptionist for [name], MD.   I have reviewed the above documentation for accuracy and completeness, and I agree with the above. - [name], MD

## 2022-08-05 NOTE — Patient Instructions (Signed)
Medication Instructions:  Your physician recommends that you continue on your current medications as directed. Please refer to the Current Medication list given to you today.  *If you need a refill on your cardiac medications before your next appointment, please call your pharmacy*  Testing/Procedures: CARDIAC PET- Your physician has requested that you have a Cardiac Pet Stress Test. This testing is completed at [facility] ([place], [place] [address]). The schedulers will call you to get this scheduled. Please follow instructions below and call the office with any questions/concerns [phone]).  Your physician has requested that you have an echocardiogram. Echocardiography is a painless test that uses sound waves to create images of your heart. It provides your doctor with information about the size and shape of your heart and how well your heart's chambers and valves are working. This procedure takes approximately one hour. There are no restrictions for this procedure. Please do NOT wear cologne, perfume, aftershave, or lotions (deodorant is allowed). Please arrive 15 minutes prior to your appointment time.  Follow-Up: At [facility], you and your health needs are our priority.  As part of our continuing mission to provide you with exceptional heart care, we have created designated Provider Care Teams.  These Care Teams include your primary Cardiologist (physician) and Advanced Practice Providers (APPs -  Physician Assistants and Nurse Practitioners) who all work together to provide you with the care you need, when you need it.  We recommend signing up for the patient portal called "MyChart".  Sign up information is provided on this After Visit Summary.  MyChart is used to connect with patients for Virtual Visits (Telemedicine).  Patients are able to view lab/test results, encounter notes, upcoming appointments, etc.  Non-urgent messages can be sent  to your provider as well.   To learn more about what you can do with MyChart, go to [email or web address].    Your next appointment:   6 month(s)  Provider:   Dr. Gardiner Rhyme  Other Instructions How to Prepare for Your Cardiac PET/CT Stress Test:  1. Please do not take these medications before your test:   Medications that may interfere with the cardiac pharmacological stress agent (ex. nitrates - including erectile dysfunction medications, isosorbide mononitrate, tamulosin or beta-blockers) the day of the exam. (Erectile dysfunction medication should be held for at least 72 hrs prior to test) Theophylline containing medications for 12 hours. Dipyridamole 48 hours prior to the test. Your remaining medications may be taken with water.  2. Nothing to eat or drink, except water, 3 hours prior to arrival time.   NO caffeine/decaffeinated products, or chocolate 12 hours prior to arrival.  3. NO perfume, cologne or lotion  4. Total time is 1 to 2 hours; you may want to bring reading material for the waiting time.  5. Please report to Radiology at the [facility] Main Entrance 30 minutes early for your test.  [place], [place] [id number]  Diabetic Preparation:  Hold oral medications. You may take NPH and Lantus insulin. Do not take Humalog or Humulin R (Regular Insulin) the day of your test. Check blood sugars prior to leaving the house. If able to eat breakfast prior to 3 hour fasting, you may take all medications, including your insulin, Do not worry if you miss your breakfast dose of insulin - start at your next meal.  IF YOU THINK YOU MAY BE PREGNANT, OR ARE NURSING PLEASE INFORM THE TECHNOLOGIST.  In preparation  for your appointment, medication and supplies will be purchased.  Appointment availability is limited, so if you need to cancel or reschedule, please call the Radiology Department at [phone]  24 hours in advance to avoid a cancellation fee of  $100.00  What to Expect After you Arrive:  Once you arrive and check in for your appointment, you will be taken to a preparation room within the Radiology Department.  A technologist or Nurse will obtain your medical history, verify that you are correctly prepped for the exam, and explain the procedure.  Afterwards,  an IV will be started in your arm and electrodes will be placed on your skin for EKG monitoring during the stress portion of the exam. Then you will be escorted to the PET/CT scanner.  There, staff will get you positioned on the scanner and obtain a blood pressure and EKG.  During the exam, you will continue to be connected to the EKG and blood pressure machines.  A small, safe amount of a radioactive tracer will be injected in your IV to obtain a series of pictures of your heart along with an injection of a stress agent.    After your Exam:  It is recommended that you eat a meal and drink a caffeinated beverage to counter act any effects of the stress agent.  Drink plenty of fluids for the remainder of the day and urinate frequently for the first couple of hours after the exam.  Your doctor will inform you of your test results within 7-10 business days.  For questions about your test or how to prepare for your test, please call: Marchia Bond, Cardiac Imaging Nurse Navigator  Gordy Clement, Cardiac Imaging Nurse Navigator Office: [phone]

## 2022-08-05 NOTE — Progress Notes (Signed)
Cardiology Office Note:    Date:  08/05/2022   ID:  Miguel Peterson, DOB 1951-05-25, MRN [id number]  PCP:  Derinda Late, MD  Cardiologist:  None  Electrophysiologist:  None   Referring MD: Derinda Late, MD   Chief Complaint  Patient presents with   Coronary Artery Disease    History of Present Illness:    Miguel Peterson is a 72 y.o. male with a hx of hyperlipidemia who presents for follow-up.  He was referred by Dr. Sandi Mariscal for an evaluation of chest pain, initially seen on 09/10/2019.  Reports that starting on 3/31 he began having chest pain in the left side of his chest.  Describes as dull aching pain, 1-2 out of 10 in intensity.  Did not resolve so on 4/1 he went to his PCP where he was noted to have subtle changes on his EKG.  Chest x-ray was done which was negative.  Reports pain resolved on 4/2.  Did note some worsening in chest pain with certain movements but denies any relationship with exertion.  Reports that he walks 2-3 times per week for 30 to 45 minutes.  Has not noted exertional chest pain.  He has been on Lipitor for hyperlipidemia, but currently being held due to transaminitis.  BP elevated in clinic today, but he denies any history of hypertension.  He has been off Lipitor for about 1 month.  No smoking history.  Reports father had MI in 27s.   Coronary CTA on 09/27/2019 showed noncalcified plaque in the proximal RCA causing minimal (25 to 49%) stenosis and calcified plaque in proximal LAD and ostium of small ramus branch causing minimal (0 to 24%) stenosis.  Calcium score 14 (27th percentile).   Since last clinic visit, he reports he is doing okay.  States that he has been having chest pain, reports couple episodes over the last few months.  Describes as left-sided dull aching pain, 1-2 out of 10 in intensity.  Typically episodes last for few minutes.  Has not noted relationship with exertion.  Denies any dyspnea, lightheadedness, syncope, lower extremity edema, or  palpitations.     Past Medical History:  Diagnosis Date   High blood pressure    High cholesterol    Hx of adenomatous colonic polyps 12/12/2018    Past Surgical History:  Procedure Laterality Date   COLONOSCOPY     CYST REMOVAL NECK     benign/ long time ago per pt.   TONSILLECTOMY     as a child    Current Medications: Current Meds  Medication Sig   Multiple Vitamins-Minerals (CENTRUM SILVER 50+MEN PO) Take by mouth.   Omega-3 Fatty Acids (FISH OIL) 1200 MG CAPS Take by mouth.   sildenafil (REVATIO) 20 MG tablet Take 20 mg by mouth 3 (three) times daily.   simvastatin (ZOCOR) 40 MG tablet Take 40 mg by mouth daily.   Vitamin D, Cholecalciferol, 25 MCG (1000 UT) CAPS Take 25 mcg by mouth.   Current Facility-Administered Medications for the 08/05/22 encounter (Office Visit) with Donato Heinz, MD  Medication   0.9 %  sodium chloride infusion     Allergies:   Patient has no known allergies.   Social History   Socioeconomic History   Marital status: Married    Spouse name: Not on file   Number of children: Not on file   Years of education: Not on file   Highest education level: Not on file  Occupational History   Occupation: Network engineer  Employer: [employer]  Tobacco Use   Smoking status: Never   Smokeless tobacco: Never  Vaping Use   Vaping Use: Never used  Substance and Sexual Activity   Alcohol use: Yes    Comment: 24 oz of beer   Drug use: Never   Sexual activity: Not on file  Other Topics Concern   Not on file  Social History Narrative   Not on file   Social Determinants of Health   Financial Resource Strain: Not on file  Food Insecurity: Not on file  Transportation Needs: Not on file  Physical Activity: Not on file  Stress: Not on file  Social Connections: Not on file     Family History: The patient's family history includes Healthy in his brother, brother, sister, and sister; Heart disease in his father; Obesity in his  father; Sudden death in his father.  ROS:   Please see the history of present illness.     All other systems reviewed and are negative.  EKGs/Labs/Other Studies Reviewed:    The following studies were reviewed today:   EKG:  EKG is ordered today.  The ekg ordered  demonstrates normal sinus rhythm, rate 64, nonspecific T wave flattening  Recent Labs: 05/13/2022: ALT 32; BUN 17; Creatinine 1.1; Hemoglobin 14.7; Platelets 216; Potassium 4.2; Sodium 142; TSH 3.66  Recent Lipid Panel    Component Value Date/Time   CHOL 106 05/13/2022 0000   CHOL 108 07/17/2020 1107   TRIG 50 05/13/2022 0000   HDL 64 05/13/2022 0000   HDL 49 07/17/2020 1107   CHOLHDL 2.2 07/17/2020 1107   LDLCALC 30 05/13/2022 0000   LDLCALC 43 07/17/2020 1107    Physical Exam:    VS:  BP 108/64   Pulse 61   Ht 6' (1.829 m)   Wt 270 lb 6.4 oz (122.7 kg)   SpO2 97%   BMI 36.67 kg/m     Wt Readings from Last 3 Encounters:  08/05/22 270 lb 6.4 oz (122.7 kg)  08/05/22 265 lb (120.2 kg)  07/08/20 270 lb (122.5 kg)     GEN:  Well nourished, well developed in no acute distress HEENT: Normal NECK: No JVD; No carotid bruits CARDIAC: RRR, no murmurs, rubs, gallops RESPIRATORY:  Clear to auscultation without rales, wheezing or rhonchi  ABDOMEN: Soft, non-tender, non-distended MUSCULOSKELETAL:  No edema; No deformity  SKIN: Warm and dry NEUROLOGIC:  Alert and oriented x 3 PSYCHIATRIC:  Normal affect   ASSESSMENT:    1. Coronary artery disease involving native coronary artery of native heart, unspecified whether angina present   2. Essential hypertension   3. Hyperlipidemia, unspecified hyperlipidemia type   4. Class 2 obesity with body mass index (BMI) of 36.0 to 36.9 in adult, unspecified obesity type, unspecified whether serious comorbidity present   5. Chest pain of uncertain etiology     PLAN:     CAD: Coronary CTA on 09/27/2019 showed noncalcified plaque in the proximal RCA causing minimal (25 to  49%) stenosis and calcified plaque in proximal LAD and ostium of small ramus branch causing minimal (0 to 24%) stenosis.  Calcium score 14 (27th percentile). -Continue simvastatin 40 mg daily and Zetia 10 mg daily.   -He is having atypical chest pain recently, will evaluate further with stress PET.  Recommend echocardiogram to rule out structural heart disease.   Hyperlipidemia: Had been on atorvastatin 80 mg daily, but held due to transaminitis.  LDL 130 on 09/10/2019 off of atorvastatin.  Now on simvastatin  40 mg daily and zetia 10 mg daily, LDL 30 05/2022   Hypertension: On losartan 50 mg daily.  Appears controlled.  Obesity: Body mass index is 36.67 kg/m.  Being seen at [facility].   RTC in 6 months   Shared Decision Making/Informed Consent The risks [chest pain, shortness of breath, cardiac arrhythmias, dizziness, blood pressure fluctuations, myocardial infarction, stroke/transient ischemic attack, nausea, vomiting, allergic reaction, radiation exposure, metallic taste sensation and life-threatening complications (estimated to be 1 in 10,000)], benefits (risk stratification, diagnosing coronary artery disease, treatment guidance) and alternatives of a cardiac PET stress test were discussed in detail with Mr. Jablonowski and he agrees to proceed.     Medication Adjustments/Labs and Tests Ordered: Current medicines are reviewed at length with the patient today.  Concerns regarding medicines are outlined above.  Orders Placed This Encounter  Procedures   NM PET CT CARDIAC PERFUSION MULTI W/ABSOLUTE BLOODFLOW   Cardiac Stress Test: Informed Consent Details: Physician/Practitioner Attestation; Transcribe to consent form and obtain patient signature   ECHOCARDIOGRAM COMPLETE   No orders of the defined types were placed in this encounter.   Patient Instructions  Medication Instructions:  Your physician recommends that you continue on your current medications as directed.  Please refer to the Current Medication list given to you today.  *If you need a refill on your cardiac medications before your next appointment, please call your pharmacy*  Testing/Procedures: CARDIAC PET- Your physician has requested that you have a Cardiac Pet Stress Test. This testing is completed at [facility] ([place], [place] [address]). The schedulers will call you to get this scheduled. Please follow instructions below and call the office with any questions/concerns [phone]).  Your physician has requested that you have an echocardiogram. Echocardiography is a painless test that uses sound waves to create images of your heart. It provides your doctor with information about the size and shape of your heart and how well your heart's chambers and valves are working. This procedure takes approximately one hour. There are no restrictions for this procedure. Please do NOT wear cologne, perfume, aftershave, or lotions (deodorant is allowed). Please arrive 15 minutes prior to your appointment time.  Follow-Up: At [facility], you and your health needs are our priority.  As part of our continuing mission to provide you with exceptional heart care, we have created designated Provider Care Teams.  These Care Teams include your primary Cardiologist (physician) and Advanced Practice Providers (APPs -  Physician Assistants and Nurse Practitioners) who all work together to provide you with the care you need, when you need it.  We recommend signing up for the patient portal called "MyChart".  Sign up information is provided on this After Visit Summary.  MyChart is used to connect with patients for Virtual Visits (Telemedicine).  Patients are able to view lab/test results, encounter notes, upcoming appointments, etc.  Non-urgent messages can be sent to your provider as well.   To learn more about what you can do with MyChart, go to [email or web address].     Your next appointment:   6 month(s)  Provider:   Dr. Gardiner Rhyme  Other Instructions How to Prepare for Your Cardiac PET/CT Stress Test:  1. Please do not take these medications before your test:   Medications that may interfere with the cardiac pharmacological stress agent (ex. nitrates - including erectile dysfunction medications, isosorbide mononitrate, tamulosin or beta-blockers) the day of the exam. (Erectile dysfunction medication should be held for  at least 72 hrs prior to test) Theophylline containing medications for 12 hours. Dipyridamole 48 hours prior to the test. Your remaining medications may be taken with water.  2. Nothing to eat or drink, except water, 3 hours prior to arrival time.   NO caffeine/decaffeinated products, or chocolate 12 hours prior to arrival.  3. NO perfume, cologne or lotion  4. Total time is 1 to 2 hours; you may want to bring reading material for the waiting time.  5. Please report to Radiology at the [facility] Main Entrance 30 minutes early for your test.  [place], [place] [address]  Diabetic Preparation:  Hold oral medications. You may take NPH and Lantus insulin. Do not take Humalog or Humulin R (Regular Insulin) the day of your test. Check blood sugars prior to leaving the house. If able to eat breakfast prior to 3 hour fasting, you may take all medications, including your insulin, Do not worry if you miss your breakfast dose of insulin - start at your next meal.  IF YOU THINK YOU MAY BE PREGNANT, OR ARE NURSING PLEASE INFORM THE TECHNOLOGIST.  In preparation for your appointment, medication and supplies will be purchased.  Appointment availability is limited, so if you need to cancel or reschedule, please call the Radiology Department at [phone]  24 hours in advance to avoid a cancellation fee of $100.00  What to Expect After you Arrive:  Once you arrive and check in for your appointment, you will be  taken to a preparation room within the Radiology Department.  A technologist or Nurse will obtain your medical history, verify that you are correctly prepped for the exam, and explain the procedure.  Afterwards,  an IV will be started in your arm and electrodes will be placed on your skin for EKG monitoring during the stress portion of the exam. Then you will be escorted to the PET/CT scanner.  There, staff will get you positioned on the scanner and obtain a blood pressure and EKG.  During the exam, you will continue to be connected to the EKG and blood pressure machines.  A small, safe amount of a radioactive tracer will be injected in your IV to obtain a series of pictures of your heart along with an injection of a stress agent.    After your Exam:  It is recommended that you eat a meal and drink a caffeinated beverage to counter act any effects of the stress agent.  Drink plenty of fluids for the remainder of the day and urinate frequently for the first couple of hours after the exam.  Your doctor will inform you of your test results within 7-10 business days.  For questions about your test or how to prepare for your test, please call: Marchia Bond, Cardiac Imaging Nurse Navigator  Gordy Clement, Cardiac Imaging Nurse Navigator Office: [phone]     Signed, Donato Heinz, MD  08/05/2022 2:44 PM    [place]

## 2022-08-26 ENCOUNTER — Ambulatory Visit (INDEPENDENT_AMBULATORY_CARE_PROVIDER_SITE_OTHER): Payer: Medicare PPO | Admitting: Family Medicine

## 2022-08-26 ENCOUNTER — Encounter (INDEPENDENT_AMBULATORY_CARE_PROVIDER_SITE_OTHER): Payer: Self-pay | Admitting: Family Medicine

## 2022-08-26 VITALS — BP 128/65 | HR 64 | Temp 98.2°F | Ht 72.0 in | Wt 263.0 lb

## 2022-08-26 DIAGNOSIS — I10 Essential (primary) hypertension: Secondary | ICD-10-CM | POA: Diagnosis not present

## 2022-08-26 DIAGNOSIS — Z6835 Body mass index (BMI) 35.0-35.9, adult: Secondary | ICD-10-CM | POA: Diagnosis not present

## 2022-08-26 DIAGNOSIS — E669 Obesity, unspecified: Secondary | ICD-10-CM

## 2022-08-26 NOTE — Progress Notes (Deleted)
Since last appointment he went away to [place] for a week and enjoyed himself.  He is leaving at the end of April for 2 weeks to go to [place] for another vacation.  Since returning home he voices he hasn't been as strict as he was when he first started.  He hasn't really been hungry with plan or without following plan.  He tries to stay mindful of carb intake.  Has a membership to [employer].

## 2022-08-27 ENCOUNTER — Telehealth (HOSPITAL_COMMUNITY): Payer: Self-pay | Admitting: *Deleted

## 2022-08-27 NOTE — Telephone Encounter (Signed)
Reaching out to patient to offer assistance regarding upcoming cardiac imaging study; pt verbalizes understanding of appt date/time, parking situation and where to check in, and verified current allergies; name and call back number provided for further questions should they arise  Miguel Clement RN Navigator Cardiac Imaging [facility] [phone] office [phone] cell  Patient aware to avoid caffeine 12 hours prior to his cardiac PET scan.

## 2022-08-30 MED ORDER — REGADENOSON 0.4 MG/5ML IV SOLN: 0.4000 mg | Freq: Once | INTRAVENOUS | Status: AC

## 2022-08-31 ENCOUNTER — Ambulatory Visit (HOSPITAL_COMMUNITY)
Admission: RE | Admit: 2022-08-31 | Discharge: 2022-08-31 | Disposition: A | Discharge status: Home / Self Care | Payer: Medicare PPO | Source: Ambulatory Visit | Attending: Cardiology | Admitting: Cardiology

## 2022-08-31 DIAGNOSIS — I2510 Atherosclerotic heart disease of native coronary artery without angina pectoris: Secondary | ICD-10-CM | POA: Diagnosis not present

## 2022-08-31 DIAGNOSIS — R079 Chest pain, unspecified: Secondary | ICD-10-CM

## 2022-08-31 LAB — NM PET CT CARDIAC PERFUSION MULTI W/ABSOLUTE BLOODFLOW
LV dias vol: 114 mL (ref 62–150)
LV sys vol: 25 mL
MBFR: 2.18
Nuc Rest EF: 67 %
Nuc Stress EF: 78 %
Rest MBF: 1.19 ml/g/min
Rest Nuclear Isotope Dose: 30 mCi
ST Depression (mm): 0 mm
Stress MBF: 2.6 ml/g/min
Stress Nuclear Isotope Dose: 30 mCi
TID: 1.09

## 2022-08-31 MED ORDER — REGADENOSON 0.4 MG/5ML IV SOLN
INTRAVENOUS | Status: AC
  Administered 2022-08-31: 0.4 mg via INTRAVENOUS
  Filled 2022-08-31: qty 5

## 2022-08-31 MED ORDER — RUBIDIUM RB82 GENERATOR (RUBYFILL)
30.0000 | PACK | Freq: Once | INTRAVENOUS | Status: AC
  Administered 2022-08-31: 30 via INTRAVENOUS

## 2022-08-31 MED ORDER — CAFFEINE CITRATE BASE COMPONENT 10 MG/ML IV SOLN
INTRAVENOUS | Status: AC
  Filled 2022-08-31: qty 3

## 2022-08-31 MED ORDER — DEXTROSE 5 % IV SOLN
INTRAVENOUS | Status: AC
  Filled 2022-08-31: qty 50

## 2022-08-31 NOTE — Progress Notes (Signed)
Chief Complaint:   OBESITY Miguel Peterson is here to discuss his progress with his obesity treatment plan along with follow-up of his obesity related diagnoses. Miguel Peterson is on the Category 4 plan, journaling breakfast with goal of 350-500 calories and 30+ grams of protein, and journaling lunch with goal of 450-600 calories and 40+ grams of protein and states he is following his eating plan approximately 60% of the time. Miguel Peterson states he has not been exercising.  Today's visit was #: 4 Starting weight: 273 lbs Starting date: 07/06/2022 Today's weight: 263 lbs Today's date: 08/26/2022 Total lbs lost to date: 10 Total lbs lost since last in-office visit: -2  Interim History:  Since last appointment he went away to [place] for a week and enjoyed himself.  He is leaving at the end of April for 2 weeks to go to [place] for another vacation.  Since returning home he voices he hasn't been as strict as he was when he first started.  He hasn't really been hungry with plan or without following plan.  He tries to stay mindful of carb intake.  Has a membership to [employer].   Subjective:   1. Essential hypertension Blood pressure well-controlled today. No chest pain, chest pressure, headache.   Assessment/Plan:   1. Essential hypertension Continue Cozaar.  2. BMI 35.0-35.9,adult     Obesity with starting BMI of 37.1 Miguel Peterson is currently in the action stage of change. As such, his goal is to continue with weight loss efforts. He has agreed to the Category 4 plan, journaling breakfast with goal of 350-500 calories and 30+ grams of protein, and journaling lunch with goal of 450-600 calories and 40+ grams of protein.  Exercise goals: All adults should avoid inactivity. Some physical activity is better than none, and adults who participate in any amount of physical activity gain some health benefits.  Behavioral modification strategies: increasing lean protein intake, meal planning and  cooking strategies, keeping healthy foods in the home, and planning for success.  Miguel Peterson has agreed to follow-up with our clinic in 3 weeks. He was informed of the importance of frequent follow-up visits to maximize his success with intensive lifestyle modifications for his multiple health conditions.   Objective:   Blood pressure 128/65, pulse 64, temperature 98.2 F (36.8 C), height 6' (1.829 m), weight 263 lb (119.3 kg), SpO2 98 %. Body mass index is 35.67 kg/m.  General: Cooperative, alert, well developed, in no acute distress. HEENT: Conjunctivae and lids unremarkable. Cardiovascular: Regular rhythm.  Lungs: Normal work of breathing. Neurologic: No focal deficits.   Lab Results  Component Value Date   CREATININE 1.1 05/13/2022   BUN 17 05/13/2022   NA 142 05/13/2022   K 4.2 05/13/2022   CL 106 05/13/2022   CO2 24 (A) 05/13/2022   Lab Results  Component Value Date   ALT 32 05/13/2022   AST 25 05/13/2022   ALKPHOS 99 05/13/2022   BILITOT 0.8 07/17/2020   Lab Results  Component Value Date   HGBA1C 6.0 05/13/2022   Lab Results  Component Value Date   INSULIN 11.0 07/06/2022   Lab Results  Component Value Date   TSH 3.66 05/13/2022   Lab Results  Component Value Date   CHOL 106 05/13/2022   HDL 64 05/13/2022   LDLCALC 30 05/13/2022   TRIG 50 05/13/2022   CHOLHDL 2.2 07/17/2020   Lab Results  Component Value Date   VD25OH 42.9 07/06/2022   VD25OH 27.3  05/13/2022   Lab Results  Component Value Date   WBC 4.9 05/13/2022   HGB 14.7 05/13/2022   HCT 44 05/13/2022   PLT 216 05/13/2022   No results found for: "IRON", "TIBC", "FERRITIN"  Attestation Statements:   Reviewed by clinician on day of visit: allergies, medications, problem list, medical history, surgical history, family history, social history, and previous encounter notes.  I, Dawn Whitmire, FNP-C, am acting as Location manager for [name], MD.  I have reviewed the above  documentation for accuracy and completeness, and I agree with the above. - [name], MD

## 2022-08-31 NOTE — Progress Notes (Signed)
Tolerated test well 

## 2022-09-02 ENCOUNTER — Ambulatory Visit (HOSPITAL_COMMUNITY): Payer: Medicare PPO | Attending: Internal Medicine

## 2022-09-02 DIAGNOSIS — R079 Chest pain, unspecified: Secondary | ICD-10-CM | POA: Insufficient documentation

## 2022-09-02 DIAGNOSIS — I2510 Atherosclerotic heart disease of native coronary artery without angina pectoris: Secondary | ICD-10-CM | POA: Insufficient documentation

## 2022-09-02 LAB — ECHOCARDIOGRAM COMPLETE
Area-P 1/2: 3.02 cm2
S' Lateral: 2.3 cm

## 2022-09-21 ENCOUNTER — Encounter (INDEPENDENT_AMBULATORY_CARE_PROVIDER_SITE_OTHER): Payer: Self-pay | Admitting: Family Medicine

## 2022-09-21 ENCOUNTER — Ambulatory Visit (INDEPENDENT_AMBULATORY_CARE_PROVIDER_SITE_OTHER): Payer: Medicare PPO | Admitting: Family Medicine

## 2022-09-21 VITALS — BP 127/70 | HR 91 | Temp 98.4°F | Ht 72.0 in | Wt 268.0 lb

## 2022-09-21 DIAGNOSIS — I10 Essential (primary) hypertension: Secondary | ICD-10-CM | POA: Diagnosis not present

## 2022-09-21 DIAGNOSIS — Z6836 Body mass index (BMI) 36.0-36.9, adult: Secondary | ICD-10-CM

## 2022-09-21 DIAGNOSIS — Z6837 Body mass index (BMI) 37.0-37.9, adult: Secondary | ICD-10-CM

## 2022-09-21 DIAGNOSIS — E669 Obesity, unspecified: Secondary | ICD-10-CM | POA: Diagnosis not present

## 2022-09-21 NOTE — Progress Notes (Signed)
Chief Complaint:   OBESITY Miguel Peterson is here to discuss his progress with his obesity treatment plan along with follow-up of his obesity related diagnoses. Miguel Peterson is on the Category 4 Plan and keeping a food journal and adhering to recommended goals of 450-600+40+protein at dinner, 350-500 calories and 30+  protein at lunch and states he is following his eating plan approximately 60% of the time. Miguel Peterson states he is not exercising.   Today's visit was #: 5 Starting weight: 273 lbs Starting date: 07/06/2022 Today's weight: 268 lbs Today's date: 09/21/2022 Total lbs lost to date: 5 lbs Total lbs lost since last in-office visit:  +5 LBS  Interim History: Patient has had quite a few celebrations/ birthday celebrations since last appointment. He can realize that the indulgence quantity was more excessive than it should be.  He will be in [place] for 3 weeks on a cruise next week.  He will be walking and taking tours.    Subjective:   1. Essential hypertension Blood pressure controlled today.  Patient denies chest pain, chest pressure, headache.  Assessment/Plan:   1. Essential hypertension Continue Cozaar, no change in dose.  2. BMI 36.0-36.9,adult  3. Obesity with starting BMI of 37.1 Miguel Peterson is currently in the action stage of change. As such, his goal is to continue with weight loss efforts. He has agreed to the Category 4 Plan.   Exercise goals: All adults should avoid inactivity. Some physical activity is better than none, and adults who participate in any amount of physical activity gain some health benefits.  Patient agrees to start 15-minute 3-4 times per week when coming back from vacation.  Behavioral modification strategies: increasing lean protein intake, meal planning and cooking strategies, keeping healthy foods in the home, and planning for success.  Miguel Peterson has agreed to follow-up with our clinic in 4-5 weeks. He was informed of the importance of frequent follow-up visits to  maximize his success with intensive lifestyle modifications for his multiple health conditions.   Objective:   Blood pressure 127/70, pulse 91, temperature 98.4 F (36.9 C), height 6' (1.829 m), weight 268 lb (121.6 kg), SpO2 96 %. Body mass index is 36.35 kg/m.  General: Cooperative, alert, well developed, in no acute distress. HEENT: Conjunctivae and lids unremarkable. Cardiovascular: Regular rhythm.  Lungs: Normal work of breathing. Neurologic: No focal deficits.   Lab Results  Component Value Date   CREATININE 1.1 05/13/2022   BUN 17 05/13/2022   NA 142 05/13/2022   K 4.2 05/13/2022   CL 106 05/13/2022   CO2 24 (A) 05/13/2022   Lab Results  Component Value Date   ALT 32 05/13/2022   AST 25 05/13/2022   ALKPHOS 99 05/13/2022   BILITOT 0.8 07/17/2020   Lab Results  Component Value Date   HGBA1C 6.0 05/13/2022   Lab Results  Component Value Date   INSULIN 11.0 07/06/2022   Lab Results  Component Value Date   TSH 3.66 05/13/2022   Lab Results  Component Value Date   CHOL 106 05/13/2022   HDL 64 05/13/2022   LDLCALC 30 05/13/2022   TRIG 50 05/13/2022   CHOLHDL 2.2 07/17/2020   Lab Results  Component Value Date   VD25OH 42.9 07/06/2022   VD25OH 27.3 05/13/2022   Lab Results  Component Value Date   WBC 4.9 05/13/2022   HGB 14.7 05/13/2022   HCT 44 05/13/2022   PLT 216 05/13/2022   No results found for: "IRON", "TIBC", "FERRITIN"  Attestation  Statements:   Reviewed by clinician on day of visit: allergies, medications, problem list, medical history, surgical history, family history, social history, and previous encounter notes.  I, Malcolm Metro, RMA, am acting as transcriptionist for Reuben Likes, MD.  I have reviewed the above documentation for accuracy and completeness, and I agree with the above. - Reuben Likes, MD

## 2022-10-15 ENCOUNTER — Encounter (HOSPITAL_BASED_OUTPATIENT_CLINIC_OR_DEPARTMENT_OTHER): Payer: Self-pay | Admitting: Pulmonary Disease

## 2022-10-15 ENCOUNTER — Ambulatory Visit (HOSPITAL_BASED_OUTPATIENT_CLINIC_OR_DEPARTMENT_OTHER): Payer: Medicare PPO | Admitting: Pulmonary Disease

## 2022-10-15 VITALS — BP 124/68 | HR 78 | Ht 72.0 in | Wt 271.0 lb

## 2022-10-15 DIAGNOSIS — G4733 Obstructive sleep apnea (adult) (pediatric): Secondary | ICD-10-CM | POA: Insufficient documentation

## 2022-10-15 NOTE — Assessment & Plan Note (Signed)
We reviewed home sleep test in detail.  We correlated with his symptoms.  His symptoms correlate more with severe OSA.  We discussed treatment options.  He was willing to initiate CPAP therapy and we will start with auto CPAP 5 to 15 cm with nasal cradle mask.  It is likely that he is a mouth breather.  We discussed alternative of AirFit F30 fullface mask.   The pathophysiology of obstructive sleep apnea , it's cardiovascular consequences & modes of treatment including CPAP were discused with the patient in detail & they evidenced understanding.  Weight loss encouraged, compliance with goal of at least 4-6 hrs every night is the expectation. Advised against medications with sedative side effects Cautioned against driving when sleepy - understanding that sleepiness will vary on a day to day basis

## 2022-10-15 NOTE — Patient Instructions (Signed)
Rx for autoCPAP 5-15 cm with nasal cradle mask If needed , can trial airfit F30 full face mask

## 2022-10-15 NOTE — Progress Notes (Signed)
Subjective:    Patient ID: Miguel Peterson, male    DOB: 1951-04-04, 72 y.o.   MRN: [id number]  HPI   Chief Complaint  Patient presents with   Follow-up    Pt states he has been told that he snores at night. States at times he does wake up feeling like he has not slept at all.   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  They have already obtained for sleep test.  Both her sons have OSA and are on CPAP machines. He reports loud snoring choking and gasping episodes in his sleep.  [name] has witnessed apneas reports sleepiness score is 6 reports some sleep pressure in the afternoons. Bedtime is around 1 AM, sleep latency about 30 minutes, she sleeps on his back with 1 pillow, reports 1-3 nocturnal awakenings including nocturia and is out of bed at 10 AM feeling tired with dryness of mouth but denies headaches.  There is no history suggestive of cataplexy, sleep paralysis or parasomnias  Significant tests/ events reviewed  Home sleep test 08/2022 AHI 36/hour low saturation 83%. Repeat testing showed AHI 11.4/hour   Past Medical History:  Diagnosis Date   High blood pressure    High cholesterol    Hx of adenomatous colonic polyps 12/12/2018    Past Surgical History:  Procedure Laterality Date   COLONOSCOPY     CYST REMOVAL NECK     benign/ long time ago per pt.   TONSILLECTOMY     as a child    No Known Allergies  Social History   Socioeconomic History   Marital status: Married    Spouse name: Not on file   Number of children: Not on file   Years of education: Not on file   Highest education level: Not on file  Occupational History   Occupation: PROFESSOR    Employer: [employer]  Tobacco Use   Smoking status: Never   Smokeless tobacco: Never  Vaping Use   Vaping Use: Never used  Substance and Sexual Activity   Alcohol use: Yes    Comment: 24 oz of beer   Drug use: Never    Sexual activity: Not on file  Other Topics Concern   Not on file  Social History Narrative   Not on file   Social Determinants of Health   Financial Resource Strain: Not on file  Food Insecurity: Not on file  Transportation Needs: Not on file  Physical Activity: Not on file  Stress: Not on file  Social Connections: Not on file  Intimate Partner Violence: Not on file    Family History  Problem Relation Age of Onset   Heart disease Father    Sudden death Father    Obesity Father    Healthy Sister    Healthy Sister    Healthy Brother    Healthy Brother      Review of Systems Constitutional: negative for anorexia, fevers and sweats  Eyes: negative for irritation, redness and visual disturbance  Ears, nose, mouth, throat, and face: negative for earaches, epistaxis, nasal congestion and sore throat  Respiratory: negative for cough, dyspnea on exertion, sputum and wheezing  Cardiovascular: negative for chest pain, dyspnea, lower extremity edema, orthopnea, palpitations and syncope  Gastrointestinal: negative for abdominal pain, constipation, diarrhea, melena, nausea and vomiting  Genitourinary:negative for dysuria, frequency and hematuria  Hematologic/lymphatic: negative for bleeding, easy bruising and lymphadenopathy  Musculoskeletal:negative for arthralgias, muscle  weakness and stiff joints  Neurological: negative for coordination problems, gait problems, headaches and weakness  Endocrine: negative for diabetic symptoms including polydipsia, polyuria and weight loss     Objective:   Physical Exam  Gen. Pleasant, obese, in no distress, normal affect ENT - no pallor,icterus, no post nasal drip, class 2-3 airway Neck: No JVD, no thyromegaly, no carotid bruits Lungs: no use of accessory muscles, no dullness to percussion, decreased without rales or rhonchi  Cardiovascular: Rhythm regular, heart sounds  normal, no murmurs or gallops, no peripheral edema Abdomen: soft and  non-tender, no hepatosplenomegaly, BS normal. Musculoskeletal: No deformities, no cyanosis or clubbing Neuro:  alert, non focal, no tremors       Assessment & Plan:

## 2022-10-26 ENCOUNTER — Ambulatory Visit (INDEPENDENT_AMBULATORY_CARE_PROVIDER_SITE_OTHER): Payer: Medicare PPO | Admitting: Family Medicine

## 2022-10-26 ENCOUNTER — Encounter (INDEPENDENT_AMBULATORY_CARE_PROVIDER_SITE_OTHER): Payer: Self-pay

## 2022-10-27 ENCOUNTER — Ambulatory Visit (INDEPENDENT_AMBULATORY_CARE_PROVIDER_SITE_OTHER): Payer: Medicare PPO | Admitting: Family Medicine

## 2022-10-27 ENCOUNTER — Encounter (INDEPENDENT_AMBULATORY_CARE_PROVIDER_SITE_OTHER): Payer: Self-pay | Admitting: Family Medicine

## 2022-10-27 VITALS — BP 128/74 | HR 84 | Temp 97.7°F | Ht 72.0 in | Wt 265.0 lb

## 2022-10-27 DIAGNOSIS — I10 Essential (primary) hypertension: Secondary | ICD-10-CM

## 2022-10-27 DIAGNOSIS — Z6835 Body mass index (BMI) 35.0-35.9, adult: Secondary | ICD-10-CM | POA: Diagnosis not present

## 2022-10-27 DIAGNOSIS — E669 Obesity, unspecified: Secondary | ICD-10-CM

## 2022-10-27 DIAGNOSIS — Z6836 Body mass index (BMI) 36.0-36.9, adult: Secondary | ICD-10-CM

## 2022-10-27 NOTE — Progress Notes (Signed)
Chief Complaint:   OBESITY Miguel Peterson is here to discuss his progress with his obesity treatment plan along with follow-up of his obesity related diagnoses. Miguel Peterson is on the Category 4 Plan and states he is following his eating plan approximately 30% of the time. Miguel Peterson states he walked for 4 hour 4 times for 2 weeks.   Today's visit was #: 6 Starting weight: 273 lbs Starting date: 07/06/2022 Today's weight: 265 lbs Today's date: 10/27/2022 Total lbs lost to date: 8 Total lbs lost since last in-office visit: 3  Interim History: Patient went on [place] cruise since last appointment.  Really enjoyed himself and saw everything he wanted to.  Going to visit a friend in [place] for [employer] Day weekend.  No plans for June.  Thinks that following meal plan is plan he would like to continue on.  May be interested in alternatives for breakfast and lunch.  [place]avorite place [place].   Subjective:   1. Essential hypertension Patient's blood pressure is well-controlled today.  He denies chest pain, chest pressure, or headache.  He is on Cozaar.  Assessment/Plan:   1. Essential hypertension Patient is to continue his current medication and dose.  2. BMI 36.0-36.9,adult  3. Obesity with starting BMI of 37.1 Miguel Peterson is currently in the action stage of change. As such, his goal is to continue with weight loss efforts. He has agreed to the Category 4 Plan.   Exercise goals: All adults should avoid inactivity. Some physical activity is better than none, and adults who participate in any amount of physical activity gain some health benefits.  Behavioral modification strategies: increasing lean protein intake, meal planning and cooking strategies, keeping healthy foods in the home, and planning for success.  Miguel Peterson has agreed to follow-up with our clinic in 4 weeks. He was informed of the importance of frequent follow-up visits to maximize his success with intensive lifestyle  modifications for his multiple health conditions.   Objective:   Blood pressure 128/74, pulse 84, temperature 97.7 F (36.5 C), height 6' (1.829 m), weight 265 lb (120.2 kg), SpO2 98 %. Body mass index is 35.94 kg/m.  General: Cooperative, alert, well developed, in no acute distress. HEENT: Conjunctivae and lids unremarkable. Cardiovascular: Regular rhythm.  Lungs: Normal work of breathing. Neurologic: No focal deficits.   Lab Results  Component Value Date   CREATININE 1.1 05/13/2022   BUN 17 05/13/2022   NA 142 05/13/2022   K 4.2 05/13/2022   CL 106 05/13/2022   CO2 24 (A) 05/13/2022   Lab Results  Component Value Date   ALT 32 05/13/2022   AST 25 05/13/2022   ALKPHOS 99 05/13/2022   BILITOT 0.8 07/17/2020   Lab Results  Component Value Date   HGBA1C 6.0 05/13/2022   Lab Results  Component Value Date   INSULIN 11.0 07/06/2022   Lab Results  Component Value Date   TSH 3.66 05/13/2022   Lab Results  Component Value Date   CHOL 106 05/13/2022   HDL 64 05/13/2022   LDLCALC 30 05/13/2022   TRIG 50 05/13/2022   CHOLHDL 2.2 07/17/2020   Lab Results  Component Value Date   VD25OH 42.9 07/06/2022   VD25OH 27.3 05/13/2022   Lab Results  Component Value Date   WBC 4.9 05/13/2022   HGB 14.7 05/13/2022   HCT 44 05/13/2022   PLT 216 05/13/2022   No results found for: "IRON", "TIBC", "FERRITIN"  Attestation Statements:   Reviewed  by clinician on day of visit: allergies, medications, problem list, medical history, surgical history, family history, social history, and previous encounter notes.   I, Burt Knack, am acting as transcriptionist for Reuben Likes, MD.  I have reviewed the above documentation for accuracy and completeness, and I agree with the above. - Reuben Likes, MD

## 2022-11-02 DIAGNOSIS — G4733 Obstructive sleep apnea (adult) (pediatric): Secondary | ICD-10-CM | POA: Diagnosis not present

## 2022-11-22 ENCOUNTER — Encounter: Payer: Self-pay | Admitting: Adult Health

## 2022-11-22 ENCOUNTER — Ambulatory Visit: Payer: Medicare PPO | Admitting: Adult Health

## 2022-11-22 VITALS — BP 114/58 | HR 74 | Ht 72.0 in | Wt 275.8 lb

## 2022-11-22 DIAGNOSIS — G4733 Obstructive sleep apnea (adult) (pediatric): Secondary | ICD-10-CM

## 2022-11-22 NOTE — Assessment & Plan Note (Signed)
Severe obstructive sleep apnea.  Patient is having difficulty tolerating CPAP.  Will adjust CPAP pressure for comfort.  Decrease CPAP AutoSet to 6 to 12 cm H2O. Will try the DreamWear nasal mask.  Plan  Patient Instructions  Try Dream wear nasal mask.  Change CPAP pressure to 6 to 12cm H2O.  CPAP Download in 4 weeks  Follow up with Dr. Vassie Loll  or Maxamillian Tienda NP in 3 months and As needed

## 2022-11-22 NOTE — Patient Instructions (Signed)
Try Dream wear nasal mask.  Change CPAP pressure to 6 to 12cm H2O.  CPAP Download in 4 weeks  Follow up with Dr. Vassie Loll  or Itzael Liptak NP in 3 months and As needed

## 2022-11-22 NOTE — Progress Notes (Signed)
@Patient  ID: Miguel Peterson, male    DOB: 12-Sep-1950, 72 y.o.   MRN: [id number]  Chief Complaint  Patient presents with   Follow-up    Referring provider: Mosetta Putt, MD  HPI: 72 year old male seen for sleep consult Oct 15, 2022 to establish for sleep apnea  TEST/EVENTS :  Home sleep test 08/2022 AHI 36/hour low saturation 83%.   11/22/2022 Follow up : OSA  Patient returns for a 1 month follow-up.  Patient was seen last visit to establish for sleep apnea.  He had recent sleep study that showed severe sleep apnea with AHI at 36/hour.  Patient was started on CPAP therapy.  Patient says he has been trying to get used to his CPAP but is having difficulty tolerating it.  He has tried nasal pillows then a fullface and then several different sizes.  Says it is very uncomfortable.  He really wants [place] to work and feel better as he has daytime sleepiness and restless sleep.  CPAP download shows 47% compliance with daily usage around 2 hours.  Patient is on auto CPAP 5 to 15 cm H2O.  Daily average pressure at 8.8 cm H2O.  AHI 10.4/hour, central events 5.5/hour.  Obstructive events 2.8/hour.  Has tried nasal pillows, full face mask. Different   No Known Allergies  Immunization History  Administered Date(s) Administered   PFIZER(Purple Top)SARS-COV-2 Vaccination 06/25/2019, 07/16/2019    Past Medical History:  Diagnosis Date   High blood pressure    High cholesterol    Hx of adenomatous colonic polyps 12/12/2018    Tobacco History: Social History   Tobacco Use  Smoking Status Never  Smokeless Tobacco Never   Counseling given: Not Answered   Outpatient Medications Prior to Visit  Medication Sig Dispense Refill   Multiple Vitamins-Minerals (CENTRUM SILVER 50+MEN PO) Take by mouth.     Omega-3 Fatty Acids (FISH OIL) 1200 MG CAPS Take by mouth.     sildenafil (REVATIO) 20 MG tablet Take 20 mg by mouth 3 (three) times daily.     simvastatin (ZOCOR) 40 MG tablet Take 40 mg by  mouth daily.     Vitamin D, Cholecalciferol, 25 MCG (1000 UT) CAPS Take 25 mcg by mouth.     ezetimibe (ZETIA) 10 MG tablet Take 1 tablet (10 mg total) by mouth daily. 90 tablet 3   losartan (COZAAR) 50 MG tablet Take 1 tablet (50 mg total) by mouth daily. 90 tablet 3   Facility-Administered Medications Prior to Visit  Medication Dose Route Frequency Provider Last Rate Last Admin   0.9 %  sodium chloride infusion  500 mL Intravenous Once Iva Boop, MD         Review of Systems:   Constitutional:   No  weight loss, night sweats,  Fevers, chills, +fatigue, or  lassitude.  HEENT:   No headaches,  Difficulty swallowing,  Tooth/dental problems, or  Sore throat,                No sneezing, itching, ear ache, nasal congestion, post nasal drip,   CV:  No chest pain,  Orthopnea, PND, swelling in lower extremities, anasarca, dizziness, palpitations, syncope.   GI  No heartburn, indigestion, abdominal pain, nausea, vomiting, diarrhea, change in bowel habits, loss of appetite, bloody stools.   Resp: No shortness of breath with exertion or at rest.  No excess mucus, no productive cough,  No non-productive cough,  No coughing up of blood.  No change in color of mucus.  No wheezing.  No chest wall deformity  Skin: no rash or lesions.  GU: no dysuria, change in color of urine, no urgency or frequency.  No flank pain, no hematuria   MS:  No joint pain or swelling.  No decreased range of motion.  No back pain.    Physical Exam  BP (!) 114/58 (BP Location: Left Arm, Patient Position: Sitting, Cuff Size: Large)   Pulse 74   Ht 6' (1.829 m)   Wt 275 lb 12.8 oz (125.1 kg)   SpO2 96%   BMI 37.41 kg/m   GEN: A/Ox3; pleasant , NAD, well nourished    HEENT:  [place]/AT,  NOSE-clear, THROAT-clear, no lesions, no postnasal drip or exudate noted.   NECK:  Supple w/ fair ROM; no JVD; normal carotid impulses w/o bruits; no thyromegaly or nodules palpated; no lymphadenopathy.    RESP  Clear  P & A;  w/o, wheezes/ rales/ or rhonchi. no accessory muscle use, no dullness to percussion  CARD:  RRR, no m/r/g, no peripheral edema, pulses intact, no cyanosis or clubbing.  GI:   Soft & nt; nml bowel sounds; no organomegaly or masses detected.   Musco: Warm bil, no deformities or joint swelling noted.   Neuro: alert, no focal deficits noted.    Skin: Warm, no lesions or rashes    Lab Results:  CBC    Component Value Date/Time   WBC 4.9 05/13/2022 0000   RBC 4.87 05/13/2022 0000   HGB 14.7 05/13/2022 0000   HCT 44 05/13/2022 0000   PLT 216 05/13/2022 0000    BMET    Component Value Date/Time   NA 142 05/13/2022 0000   K 4.2 05/13/2022 0000   CL 106 05/13/2022 0000   CO2 24 (A) 05/13/2022 0000   GLUCOSE 111 (H) 07/17/2020 1107   BUN 17 05/13/2022 0000   CREATININE 1.1 05/13/2022 0000   CREATININE 1.07 07/17/2020 1107   CALCIUM 9.1 05/13/2022 0000   GFRNONAA 71 07/17/2020 1107   GFRAA 82 07/17/2020 1107    BNP No results found for: "BNP"  ProBNP No results found for: "PROBNP"  Imaging: No results found.        No data to display          No results found for: "NITRICOXIDE"      Assessment & Plan:   OSA (obstructive sleep apnea) Severe obstructive sleep apnea.  Patient is having difficulty tolerating CPAP.  Will adjust CPAP pressure for comfort.  Decrease CPAP AutoSet to 6 to 12 cm H2O. Will try the DreamWear nasal mask.  Plan  Patient Instructions  Try Dream wear nasal mask.  Change CPAP pressure to 6 to 12cm H2O.  CPAP Download in 4 weeks  Follow up with Dr. Vassie Loll  or Atreyu Mak NP in 3 months and As needed        Rubye Oaks, NP 11/22/2022

## 2022-11-24 ENCOUNTER — Ambulatory Visit (INDEPENDENT_AMBULATORY_CARE_PROVIDER_SITE_OTHER): Payer: Medicare PPO | Admitting: Family Medicine

## 2022-11-24 ENCOUNTER — Encounter (INDEPENDENT_AMBULATORY_CARE_PROVIDER_SITE_OTHER): Payer: Self-pay | Admitting: Family Medicine

## 2022-11-24 VITALS — BP 129/68 | HR 73 | Temp 97.6°F | Ht 72.0 in | Wt 269.0 lb

## 2022-11-24 DIAGNOSIS — Z6836 Body mass index (BMI) 36.0-36.9, adult: Secondary | ICD-10-CM | POA: Diagnosis not present

## 2022-11-24 DIAGNOSIS — R739 Hyperglycemia, unspecified: Secondary | ICD-10-CM

## 2022-11-24 DIAGNOSIS — E669 Obesity, unspecified: Secondary | ICD-10-CM | POA: Diagnosis not present

## 2022-11-24 DIAGNOSIS — I10 Essential (primary) hypertension: Secondary | ICD-10-CM | POA: Diagnosis not present

## 2022-11-24 MED ORDER — OZEMPIC (0.25 OR 0.5 MG/DOSE) 2 MG/3ML ~~LOC~~ SOPN: 0.2500 mg | PEN_INJECTOR | SUBCUTANEOUS | 0 refills | Status: DC

## 2022-11-24 NOTE — Progress Notes (Signed)
Chief Complaint:   OBESITY Miguel Peterson is here to discuss his progress with his obesity treatment plan along with follow-up of his obesity related diagnoses. Miguel Peterson is on the Category 4 Plan and states he is following his eating plan approximately 50% of the time. Miguel Peterson states he is working out and walking for 20-+60 minutes 2-4 times per week.  Today's visit was #: 7 Starting weight: 273 lbs Starting date: 07/06/2022 Today's weight: 269 lbs Today's date: 11/24/2022 Total lbs lost to date: 4 Total lbs lost since last in-office visit: 0  Interim History: Patient is following meal plan 50% of the time.  Dinner isn't necessarily as planned as it was previously.  He realizes for dinner they aren't always referencing the meal plan to ensure he if following the plan.  Leaving on July 5th to go up to [facility] then to [place] and [place] for a week.   Subjective:   1. Elevated blood sugar Patient's last A1c was 6.0 and insulin 11.0. He is not on medications.   2. Essential hypertension Patient's blood pressure is controlled today. He denies chest pain, chest pressure, or headache. He is on Cozaar.   Assessment/Plan:   1. Elevated blood sugar Patient will have labs done with his PCP. Patient agreed to start Ozempic 0.25 mg SubQ weekly with  no refills.   - Semaglutide,0.25 or 0.5MG /DOS, (OZEMPIC, 0.25 OR 0.5 MG/DOSE,) 2 MG/3ML SOPN; Inject 0.25 mg into the skin once a week.  Dispense: 3 mL; Refill: 0  2. Essential hypertension Patient will continue Cozaar with no change in dose or treatment.   3. BMI 36.0-36.9,adult  4. Obesity with starting BMI of 37.1 Miguel Peterson is currently in the action stage of change. As such, his goal is to continue with weight loss efforts. He has agreed to the Category 4 Plan.   Exercise goals: All adults should avoid inactivity. Some physical activity is better than none, and adults who participate in any amount of physical activity gain some health  benefits.  Behavioral modification strategies: increasing lean protein intake, increasing vegetables, meal planning and cooking strategies, and planning for success.  Miguel Peterson has agreed to follow-up with our clinic in 4 weeks. He was informed of the importance of frequent follow-up visits to maximize his success with intensive lifestyle modifications for his multiple health conditions.   Objective:   Blood pressure 129/68, pulse 73, temperature 97.6 F (36.4 C), height 6' (1.829 m), weight 269 lb (122 kg), SpO2 100 %. Body mass index is 36.48 kg/m.  General: Cooperative, alert, well developed, in no acute distress. HEENT: Conjunctivae and lids unremarkable. Cardiovascular: Regular rhythm.  Lungs: Normal work of breathing. Neurologic: No focal deficits.   Lab Results  Component Value Date   CREATININE 1.1 05/13/2022   BUN 17 05/13/2022   NA 142 05/13/2022   K 4.2 05/13/2022   CL 106 05/13/2022   CO2 24 (A) 05/13/2022   Lab Results  Component Value Date   ALT 32 05/13/2022   AST 25 05/13/2022   ALKPHOS 99 05/13/2022   BILITOT 0.8 07/17/2020   Lab Results  Component Value Date   HGBA1C 6.0 05/13/2022   Lab Results  Component Value Date   INSULIN 11.0 07/06/2022   Lab Results  Component Value Date   TSH 3.66 05/13/2022   Lab Results  Component Value Date   CHOL 106 05/13/2022   HDL 64 05/13/2022   LDLCALC 30 05/13/2022   TRIG 50 05/13/2022   CHOLHDL  2.2 07/17/2020   Lab Results  Component Value Date   VD25OH 42.9 07/06/2022   VD25OH 27.3 05/13/2022   Lab Results  Component Value Date   WBC 4.9 05/13/2022   HGB 14.7 05/13/2022   HCT 44 05/13/2022   PLT 216 05/13/2022   No results found for: "IRON", "TIBC", "FERRITIN"  Attestation Statements:   Reviewed by clinician on day of visit: allergies, medications, problem list, medical history, surgical history, family history, social history, and previous encounter notes.   I, Burt Knack, am acting as  transcriptionist for Reuben Likes, MD.  I have reviewed the above documentation for accuracy and completeness, and I agree with the above. - Reuben Likes, MD

## 2022-11-24 NOTE — Telephone Encounter (Signed)
Can you check PA, Thanks

## 2022-11-25 ENCOUNTER — Encounter (INDEPENDENT_AMBULATORY_CARE_PROVIDER_SITE_OTHER): Payer: Self-pay | Admitting: Family Medicine

## 2022-11-25 NOTE — Telephone Encounter (Addendum)
Prior Auth has been submitted for Ozempic. We are awaiting a response.   Your information has been submitted to [facility]. [employer] will review the request and will issue a decision, typically within 3-7 days from your submission. You can check the updated outcome later by reopening this request.  If [employer] has not responded in 3-7 days or if you have any questions about your ePA request, please contact [employer] at [phone]. If you think there may be a problem with your PA request, use our live chat feature at the bottom right.  For [place] requests, please call [phone].

## 2022-11-26 ENCOUNTER — Encounter (INDEPENDENT_AMBULATORY_CARE_PROVIDER_SITE_OTHER): Payer: Self-pay | Admitting: Family Medicine

## 2022-11-29 NOTE — Telephone Encounter (Signed)
Please advise 

## 2022-11-29 NOTE — Telephone Encounter (Signed)
Message from Plan:  You asked for the drug above for your Hyperglycemia, unspecified. This is an off-label use that is not medically accepted. The Medicare rule in the Prescription Drug Benefit Manual (Chapter 6, Section 10.6) says a drug must be used for a medically accepted indication (covered use). Off-label use is medically accepted when there is proof in one or more of the drug guides that the drug works for your condition. We look at the two major drug guides (compendia): the DRUGDEX Information System and the [facility] Formulary Service Drug Information (AHFS-DI). [employer] has decided that there is no proof in either drug guide that this drug works for your condition. Per Medicare rules, it is not covered.

## 2022-12-01 ENCOUNTER — Encounter (INDEPENDENT_AMBULATORY_CARE_PROVIDER_SITE_OTHER): Payer: Self-pay

## 2022-12-03 DIAGNOSIS — G4733 Obstructive sleep apnea (adult) (pediatric): Secondary | ICD-10-CM | POA: Diagnosis not present

## 2022-12-14 ENCOUNTER — Other Ambulatory Visit (INDEPENDENT_AMBULATORY_CARE_PROVIDER_SITE_OTHER): Payer: Self-pay | Admitting: Family Medicine

## 2022-12-14 DIAGNOSIS — I2510 Atherosclerotic heart disease of native coronary artery without angina pectoris: Secondary | ICD-10-CM

## 2022-12-14 MED ORDER — WEGOVY 0.25 MG/0.5ML ~~LOC~~ SOAJ: 0.2500 mg | SUBCUTANEOUS | 0 refills | Status: DC

## 2022-12-14 NOTE — Telephone Encounter (Signed)
Sent in [place]- can we check on PA for this and whether or not it went thru?

## 2022-12-14 NOTE — Telephone Encounter (Signed)
PA started 12/14/22

## 2022-12-15 ENCOUNTER — Encounter (INDEPENDENT_AMBULATORY_CARE_PROVIDER_SITE_OTHER): Payer: Self-pay

## 2022-12-21 ENCOUNTER — Telehealth: Payer: Self-pay | Admitting: Adult Health

## 2022-12-21 NOTE — Telephone Encounter (Signed)
Called out to pt in regards to his MyChart message received:  "I have an appointment with Ms. Clent Ridges on Sept.17 but I need an earlier one required by my insurance company [facility]). I need an appointment before Aug.25 but I'll be out of the country during that time so any date between today and August 14 is what I'm needing. Thanks!"   Pt stated he needs a OV for CPAP compliance, Pt was just seen on 11/22/2022 by Tammy Parrett, pls advise

## 2022-12-22 ENCOUNTER — Ambulatory Visit (INDEPENDENT_AMBULATORY_CARE_PROVIDER_SITE_OTHER): Payer: Medicare PPO | Admitting: Family Medicine

## 2022-12-23 NOTE — Telephone Encounter (Signed)
Pt sent MyChart msg trying get an earlier appt due to his insurance. Will it be okay for pt to double booked?

## 2022-12-23 NOTE — Telephone Encounter (Signed)
Called and spoke with patient. Patient stated he is going to of town 01/19/23 at was contacted by [facility] stating he needed to have cpap compliance follow up by 01/30/23.  Patient stated he will not be in town and needs OV before 01/19/23.  Patient LOV was 11/22/22 and currently scheduled 02/22/23 with Tammy.   Patient rescheduled 12/30/22 at 2pm per patient request for cpap insurance compliance.  Nothing further at this time.

## 2022-12-30 ENCOUNTER — Encounter: Payer: Self-pay | Admitting: Adult Health

## 2022-12-30 ENCOUNTER — Ambulatory Visit: Payer: Medicare PPO | Admitting: Adult Health

## 2022-12-30 VITALS — BP 120/60 | HR 69 | Ht 72.0 in | Wt 273.0 lb

## 2022-12-30 DIAGNOSIS — G4733 Obstructive sleep apnea (adult) (pediatric): Secondary | ICD-10-CM

## 2022-12-30 NOTE — Patient Instructions (Addendum)
Change CPAP pressure to 6 to 10 cm H2O.  Wear CPAP all night long for at least 6hr or more .  Work on healthy weight loss  Do not drive if sleepy  Make sure mask is not too loose or too big.  Follow up with Dr. Vassie Loll  or Jayshaun Phillips NP in 4 months and As needed

## 2022-12-30 NOTE — Addendum Note (Signed)
Addended by: Delrae Rend on: 12/30/2022 02:52 PM   Modules accepted: Orders

## 2022-12-30 NOTE — Progress Notes (Signed)
@Patient  ID: Miguel Peterson, male    DOB: Aug 06, 1950, 72 y.o.   MRN: [id number]  Chief Complaint  Patient presents with   Follow-up    Referring provider: Mosetta Putt, MD  HPI: 72 year old male seen for sleep consult Oct 15, 2022 to establish for sleep apnea  TEST/EVENTS :  Home sleep test 08/2022 AHI 36/hour low saturation 83%.   12/30/2022 Follow up ; OSA Patient presents for a follow-up visit.  Has underlying severe sleep apnea and was started on CPAP.  Last visit patient was having difficulty tolerating his CPAP.  Pressure was adjusted to auto CPAP 6 to 12 cm H2O.  Patient says he is starting to feel somewhat better and is able to sleep longer.  Was changed to a DreamWear nasal mask. Does feel this mask works much better.  CPAP download shows 77% compliance.  Daily average usage is 5 hours.  AHI 10.8/hour.  Positive central events at 7.5/hour and obstructive events 1.8  No Known Allergies  Immunization History  Administered Date(s) Administered   PFIZER(Purple Top)SARS-COV-2 Vaccination 06/25/2019, 07/16/2019    Past Medical History:  Diagnosis Date   High blood pressure    High cholesterol    Hx of adenomatous colonic polyps 12/12/2018    Tobacco History: Social History   Tobacco Use  Smoking Status Never  Smokeless Tobacco Never   Counseling given: Not Answered   Outpatient Medications Prior to Visit  Medication Sig Dispense Refill   Multiple Vitamins-Minerals (CENTRUM SILVER 50+MEN PO) Take by mouth.     Omega-3 Fatty Acids (FISH OIL) 1200 MG CAPS Take by mouth.     sildenafil (REVATIO) 20 MG tablet Take 20 mg by mouth 3 (three) times daily.     simvastatin (ZOCOR) 40 MG tablet Take 40 mg by mouth daily.     Vitamin D, Cholecalciferol, 25 MCG (1000 UT) CAPS Take 25 mcg by mouth.     ezetimibe (ZETIA) 10 MG tablet Take 1 tablet (10 mg total) by mouth daily. 90 tablet 3   losartan (COZAAR) 50 MG tablet Take 1 tablet (50 mg total) by mouth daily. 90 tablet  3   Semaglutide-Weight Management (WEGOVY) 0.25 MG/0.5ML SOAJ Inject 0.25 mg into the skin once a week. (Patient not taking: Reported on 12/30/2022) 2 mL 0   Facility-Administered Medications Prior to Visit  Medication Dose Route Frequency Provider Last Rate Last Admin   0.9 %  sodium chloride infusion  500 mL Intravenous Once Iva Boop, MD         Review of Systems:   Constitutional:   No  weight loss, night sweats,  Fevers, chills, fatigue, or  lassitude.  HEENT:   No headaches,  Difficulty swallowing,  Tooth/dental problems, or  Sore throat,                No sneezing, itching, ear ache, nasal congestion, post nasal drip,   CV:  No chest pain,  Orthopnea, PND, swelling in lower extremities, anasarca, dizziness, palpitations, syncope.   GI  No heartburn, indigestion, abdominal pain, nausea, vomiting, diarrhea, change in bowel habits, loss of appetite, bloody stools.   Resp: No shortness of breath with exertion or at rest.  No excess mucus, no productive cough,  No non-productive cough,  No coughing up of blood.  No change in color of mucus.  No wheezing.  No chest wall deformity  Skin: no rash or lesions.  GU: no dysuria, change in color of urine, no urgency or frequency.  No flank pain, no hematuria   MS:  No joint pain or swelling.  No decreased range of motion.  No back pain.    Physical Exam  BP 120/60 (BP Location: Left Arm, Patient Position: Sitting, Cuff Size: Large)   Pulse 69   Ht 6' (1.829 m)   Wt 273 lb (123.8 kg)   SpO2 98%   BMI 37.03 kg/m   GEN: A/Ox3; pleasant , NAD, well nourished    HEENT:  [place]/AT,  NOSE-clear, THROAT-clear, no lesions, no postnasal drip or exudate noted.   NECK:  Supple w/ fair ROM; no JVD; normal carotid impulses w/o bruits; no thyromegaly or nodules palpated; no lymphadenopathy.    RESP  Clear  P & A; w/o, wheezes/ rales/ or rhonchi. no accessory muscle use, no dullness to percussion  CARD:  RRR, no m/r/g, no peripheral edema,  pulses intact, no cyanosis or clubbing.  GI:   Soft & nt; nml bowel sounds; no organomegaly or masses detected.   Musco: Warm bil, no deformities or joint swelling noted.   Neuro: alert, no focal deficits noted.    Skin: Warm, no lesions or rashes    Lab Results:  CBC  BNP No results found for: "BNP"  ProBNP No results found for: "PROBNP"  Imaging: No results found.  Administration History     None           No data to display          No results found for: "NITRICOXIDE"      Assessment & Plan:   OSA (obstructive sleep apnea) Severe OSA - tolerating CPAP much better. Continues to have some residual events -mainly central. Could be CPAP emergent central. Will decrease CPAP pressure to see if we can decrease events. Has perceived benefit on CPAP .   Plan  Patient Instructions  Change CPAP pressure to 6 to 10 cm H2O.  Wear CPAP all night long for at least 6hr or more .  Work on healthy weight loss  Do not drive if sleepy  Make sure mask is not too loose or too big.  Follow up with Dr. Vassie Loll  or Shaila Gilchrest NP in 4 months and As needed        Rubye Oaks, NP 12/30/2022

## 2022-12-30 NOTE — Assessment & Plan Note (Signed)
Severe OSA - tolerating CPAP much better. Continues to have some residual events -mainly central. Could be CPAP emergent central. Will decrease CPAP pressure to see if we can decrease events. Has perceived benefit on CPAP .   Plan  Patient Instructions  Change CPAP pressure to 6 to 10 cm H2O.  Wear CPAP all night long for at least 6hr or more .  Work on healthy weight loss  Do not drive if sleepy  Make sure mask is not too loose or too big.  Follow up with Dr. Vassie Loll  or Palmina Clodfelter NP in 4 months and As needed

## 2023-01-03 NOTE — Progress Notes (Unsigned)
Cardiology Office Note:    Date:  01/06/2023   ID:  Miguel Peterson, DOB [birth year], MRN [id number]  PCP:  Mosetta Putt, MD  Cardiologist:  None  Electrophysiologist:  None   Referring MD: Mosetta Putt, MD   Chief Complaint  Patient presents with   Coronary Artery Disease    History of Present Illness:    Miguel Peterson is a 72 y.o. male with a hx of hyperlipidemia who presents for follow-up.  He was referred by Dr. Duaine Dredge for an evaluation of chest pain, initially seen on 09/10/2019.  Reports that starting on 3/31 he began having chest pain in the left side of his chest.  Describes as dull aching pain, 1-2 out of 10 in intensity.  Did not resolve so on 4/1 he went to his PCP where he was noted to have subtle changes on his EKG.  Chest x-ray was done which was negative.  Reports pain resolved on 4/2.  Did note some worsening in chest pain with certain movements but denies any relationship with exertion.  Reports that he walks 2-3 times per week for 30 to 45 minutes.  Has not noted exertional chest pain.  He has been on Lipitor for hyperlipidemia, but currently being held due to transaminitis.  BP elevated in clinic today, but he denies any history of hypertension.  He has been off Lipitor for about 1 month.  No smoking history.  Reports father had MI in 67s.   Coronary CTA on 09/27/2019 showed noncalcified plaque in the proximal RCA causing minimal (25 to 49%) stenosis and calcified plaque in proximal LAD and ostium of small ramus branch causing minimal (0 to 24%) stenosis.  Calcium score 14 (27th percentile).  Reported worsening chest pain, underwent stress PET 08/2022 which showed normal perfusion, EF 67%, normal myocardial blood flow reserve, mild coronary calcifications.  Echocardiogram 09/02/2022 showed EF 60 to 65%, normal RV systolic function, no significant valvular disease.   Since last clinic visit, he reports he is doing well.  Denies any chest pain, dyspnea, lightheadedness,  syncope, lower extremity edema, or palpitations or pain in legs with walking.  He has been going to the gym a couple times per week, does weight machines and walks daily for 20 to 30 minutes.  Denies exertional symptoms.   Past Medical History:  Diagnosis Date   High blood pressure    High cholesterol    Hx of adenomatous colonic polyps 12/12/2018    Past Surgical History:  Procedure Laterality Date   COLONOSCOPY     CYST REMOVAL NECK     benign/ long time ago per pt.   TONSILLECTOMY     as a child    Current Medications: Current Meds  Medication Sig   Multiple Vitamins-Minerals (CENTRUM SILVER 50+MEN PO) Take by mouth.   Omega-3 Fatty Acids (FISH OIL) 1200 MG CAPS Take by mouth.   sildenafil (REVATIO) 20 MG tablet Take 20 mg by mouth 3 (three) times daily.   simvastatin (ZOCOR) 40 MG tablet Take 40 mg by mouth daily.   Vitamin D, Cholecalciferol, 25 MCG (1000 UT) CAPS Take 25 mcg by mouth.   Current Facility-Administered Medications for the 01/06/23 encounter (Office Visit) with Little Ishikawa, MD  Medication   0.9 %  sodium chloride infusion     Allergies:   Patient has no known allergies.   Social History   Socioeconomic History   Marital status: Married    Spouse name: Not on file  Number of children: Not on file   Years of education: Not on file   Highest education level: Not on file  Occupational History   Occupation: PROFESSOR    Employer: [employer]  Tobacco Use   Smoking status: Never   Smokeless tobacco: Never  Vaping Use   Vaping status: Never Used  Substance and Sexual Activity   Alcohol use: Yes    Comment: 24 oz of beer   Drug use: Never   Sexual activity: Not on file  Other Topics Concern   Not on file  Social History Narrative   Not on file   Social Determinants of Health   Financial Resource Strain: Not on file  Food Insecurity: Not on file  Transportation Needs: Not on file  Physical Activity: Not on file  Stress:  Not on file  Social Connections: Not on file     Family History: The patient's family history includes Healthy in his brother, brother, sister, and sister; Heart disease in his father; Obesity in his father; Sudden death in his father.  ROS:   Please see the history of present illness.     All other systems reviewed and are negative.  EKGs/Labs/Other Studies Reviewed:    The following studies were reviewed today:   EKG:   01/06/2023: Sinus rhythm, PACs, rate 75, nonspecific T wave flattening  Recent Labs: 05/13/2022: ALT 32; BUN 17; Creatinine 1.1; Hemoglobin 14.7; Platelets 216; Potassium 4.2; Sodium 142; TSH 3.66  Recent Lipid Panel    Component Value Date/Time   CHOL 106 05/13/2022 0000   CHOL 108 07/17/2020 1107   TRIG 50 05/13/2022 0000   HDL 64 05/13/2022 0000   HDL 49 07/17/2020 1107   CHOLHDL 2.2 07/17/2020 1107   LDLCALC 30 05/13/2022 0000   LDLCALC 43 07/17/2020 1107    Physical Exam:    VS:  BP 126/72 (BP Location: Left Arm, Patient Position: Sitting, Cuff Size: Normal)   Pulse 75   Ht 6' (1.829 m)   Wt 274 lb 3.2 oz (124.4 kg)   SpO2 97%   BMI 37.19 kg/m     Wt Readings from Last 3 Encounters:  01/06/23 274 lb 3.2 oz (124.4 kg)  12/30/22 273 lb (123.8 kg)  11/24/22 269 lb (122 kg)     GEN:  Well nourished, well developed in no acute distress HEENT: Normal NECK: No JVD; No carotid bruits CARDIAC: RRR, no murmurs, rubs, gallops RESPIRATORY:  Clear to auscultation without rales, wheezing or rhonchi  ABDOMEN: Soft, non-tender, non-distended MUSCULOSKELETAL:  No edema; No deformity  SKIN: Warm and dry NEUROLOGIC:  Alert and oriented x 3 PSYCHIATRIC:  Normal affect   ASSESSMENT:    1. Coronary artery disease involving native coronary artery of native heart, unspecified whether angina present   2. Essential hypertension   3. Hyperlipidemia, unspecified hyperlipidemia type     PLAN:     CAD: Coronary CTA on 09/27/2019 showed noncalcified plaque  in the proximal RCA causing minimal (25 to 49%) stenosis and calcified plaque in proximal LAD and ostium of small ramus branch causing minimal (0 to 24%) stenosis.  Calcium score 14 (27th percentile).  Reported worsening chest pain, underwent stress PET 08/2022 which showed normal perfusion, EF 67%, normal myocardial blood flow reserve, mild coronary calcifications.  Echocardiogram 09/02/2022 showed EF 60 to 65%, normal RV systolic function, no significant valvular disease. -Continue simvastatin 40 mg daily and Zetia 10 mg daily.     Hyperlipidemia: Had been on atorvastatin 80 mg  daily, but held due to transaminitis.  LDL 130 on 09/10/2019 off of atorvastatin.  Now on simvastatin 40 mg daily and zetia 10 mg daily, LDL 30 05/2022   Hypertension: On losartan 50 mg daily.  Appears controlled.  Obesity: Body mass index is 37.19 kg/m.  Being seen at [facility].  OSA: On CPAP, follows with pulmonology   RTC in 1 year     Medication Adjustments/Labs and Tests Ordered: Current medicines are reviewed at length with the patient today.  Concerns regarding medicines are outlined above.  Orders Placed This Encounter  Procedures   EKG 12-Lead   No orders of the defined types were placed in this encounter.   Patient Instructions  Medication Instructions:  Continue same medications *If you need a refill on your cardiac medications before your next appointment, please call your pharmacy*   Lab Work: None ordered   Testing/Procedures: None ordered   Follow-Up: At [facility], you and your health needs are our priority.  As part of our continuing mission to provide you with exceptional heart care, we have created designated Provider Care Teams.  These Care Teams include your primary Cardiologist (physician) and Advanced Practice Providers (APPs -  Physician Assistants and Nurse Practitioners) who all work together to provide you with the care you need, when you need  it.  We recommend signing up for the patient portal called "MyChart".  Sign up information is provided on this After Visit Summary.  MyChart is used to connect with patients for Virtual Visits (Telemedicine).  Patients are able to view lab/test results, encounter notes, upcoming appointments, etc.  Non-urgent messages can be sent to your provider as well.   To learn more about what you can do with MyChart, go to [email or web address].    Your next appointment:  1 year   Call in May to schedule August appointment     Provider:  Dr.Khalon Cansler     Signed, Little Ishikawa, MD  01/06/2023 1:26 PM    [facility]

## 2023-01-06 ENCOUNTER — Encounter: Payer: Self-pay | Admitting: Cardiology

## 2023-01-06 ENCOUNTER — Ambulatory Visit: Payer: Medicare PPO | Attending: Cardiology | Admitting: Cardiology

## 2023-01-06 VITALS — BP 126/72 | HR 75 | Ht 72.0 in | Wt 274.2 lb

## 2023-01-06 DIAGNOSIS — E785 Hyperlipidemia, unspecified: Secondary | ICD-10-CM | POA: Diagnosis not present

## 2023-01-06 DIAGNOSIS — I10 Essential (primary) hypertension: Secondary | ICD-10-CM

## 2023-01-06 DIAGNOSIS — I2510 Atherosclerotic heart disease of native coronary artery without angina pectoris: Secondary | ICD-10-CM

## 2023-01-06 NOTE — Patient Instructions (Signed)
Medication Instructions:  Continue same medications *If you need a refill on your cardiac medications before your next appointment, please call your pharmacy*   Lab Work: None ordered   Testing/Procedures: None ordered   Follow-Up: At [facility], you and your health needs are our priority.  As part of our continuing mission to provide you with exceptional heart care, we have created designated Provider Care Teams.  These Care Teams include your primary Cardiologist (physician) and Advanced Practice Providers (APPs -  Physician Assistants and Nurse Practitioners) who all work together to provide you with the care you need, when you need it.  We recommend signing up for the patient portal called "MyChart".  Sign up information is provided on this After Visit Summary.  MyChart is used to connect with patients for Virtual Visits (Telemedicine).  Patients are able to view lab/test results, encounter notes, upcoming appointments, etc.  Non-urgent messages can be sent to your provider as well.   To learn more about what you can do with MyChart, go to [email or web address].    Your next appointment:  1 year   Call in May to schedule August appointment     Provider:  Dr.Schumann

## 2023-01-10 ENCOUNTER — Encounter (INDEPENDENT_AMBULATORY_CARE_PROVIDER_SITE_OTHER): Payer: Self-pay | Admitting: Family Medicine

## 2023-01-10 ENCOUNTER — Ambulatory Visit (INDEPENDENT_AMBULATORY_CARE_PROVIDER_SITE_OTHER): Payer: Medicare PPO | Admitting: Family Medicine

## 2023-01-10 VITALS — BP 133/75 | HR 63 | Temp 98.5°F | Ht 72.0 in | Wt 270.0 lb

## 2023-01-10 DIAGNOSIS — E669 Obesity, unspecified: Secondary | ICD-10-CM | POA: Diagnosis not present

## 2023-01-10 DIAGNOSIS — F5089 Other specified eating disorder: Secondary | ICD-10-CM

## 2023-01-10 DIAGNOSIS — Z6836 Body mass index (BMI) 36.0-36.9, adult: Secondary | ICD-10-CM | POA: Diagnosis not present

## 2023-01-10 DIAGNOSIS — E7849 Other hyperlipidemia: Secondary | ICD-10-CM

## 2023-01-10 MED ORDER — TOPIRAMATE 25 MG PO TABS: ORAL_TABLET | ORAL | 0 refills | Status: DC

## 2023-01-10 MED ORDER — LOMAIRA 8 MG PO TABS: ORAL_TABLET | ORAL | 0 refills | Status: DC

## 2023-01-10 NOTE — Progress Notes (Signed)
Chief Complaint:   OBESITY Miguel Peterson is here to discuss his progress with his obesity treatment plan along with follow-up of his obesity related diagnoses. Miguel Peterson is on the Category 4 Plan and states he is following his eating plan approximately 40% of the time. Miguel Peterson states he is walking for 30 minutes 6 times per week.  Today's visit was #: 8 Starting weight: 273 lbs Starting date: 07/06/2022 Today's weight: 270 lbs Today's date: 01/10/2023 Total lbs lost to date: 3 Total lbs lost since last in-office visit: 0  Interim History: Patient went to [place], [place], [place] and got COVID.  He took Paxlovid and it was minor illness.  He is headed to [place], [place], and [place] next week.  He hasn't been as consistent on meal plan due to travel. He may be interested in medication options for obesity and Qsymia was discussed today.   Subjective:   1. Other hyperlipidemia Patient's last LDL was 30, HDL 64, and triglycerides 50. He is on Zetia and fish oil. His last labs were in December 2023.  2. Other disorder of eating We discussed Qsymia today. Patient would like to proceed with makeshift Qsymia. Controlled substance contract was signed by the patient, and PDMP was checked today. His starting weights is of 270 lbs; he needs to lose 13.5 lbs by the end of November 2024.  Assessment/Plan:   1. Other hyperlipidemia Patient will continue his current medications with no change in dose.   2. Other disorder of eating Patient agreed to start Lomaira at 4 mg and start topiramate at 25 mg with no refills.   - Phentermine HCl (LOMAIRA) 8 MG TABS; Take 0.5 tablets (4 mg total) by mouth daily for 14 days, THEN 1 tablet (8 mg total) daily for 14 days.  Dispense: 23 tablet; Refill: 0 - topiramate (TOPAMAX) 25 MG tablet; Take 1 tablet (25 mg total) by mouth daily for 14 days, THEN 2 tablets (50 mg total) daily for 14 days.  Dispense: 45 tablet; Refill: 0  3. Obesity with starting BMI of 37.1  4.  BMI 36.0-36.9,adult [name] is currently in the action stage of change. As such, his goal is to continue with weight loss efforts. He has agreed to the Category 4 Plan.   Exercise goals: All adults should avoid inactivity. Some physical activity is better than none, and adults who participate in any amount of physical activity gain some health benefits.  Behavioral modification strategies: increasing lean protein intake, meal planning and cooking strategies, keeping healthy foods in the home, and planning for success.  Miguel Peterson has agreed to follow-up with our clinic in 3 to 4 weeks. He was informed of the importance of frequent follow-up visits to maximize his success with intensive lifestyle modifications for his multiple health conditions.   Objective:   Blood pressure 133/75, pulse 63, temperature 98.5 F (36.9 C), height 6' (1.829 m), weight 270 lb (122.5 kg), SpO2 98%. Body mass index is 36.62 kg/m.  General: Cooperative, alert, well developed, in no acute distress. HEENT: Conjunctivae and lids unremarkable. Cardiovascular: Regular rhythm.  Lungs: Normal work of breathing. Neurologic: No focal deficits.   Lab Results  Component Value Date   CREATININE 1.1 05/13/2022   BUN 17 05/13/2022   NA 142 05/13/2022   K 4.2 05/13/2022   CL 106 05/13/2022   CO2 24 (A) 05/13/2022   Lab Results  Component Value Date   ALT 32 05/13/2022   AST 25 05/13/2022   ALKPHOS 99  05/13/2022   BILITOT 0.8 07/17/2020   Lab Results  Component Value Date   HGBA1C 6.0 05/13/2022   Lab Results  Component Value Date   INSULIN 11.0 07/06/2022   Lab Results  Component Value Date   TSH 3.66 05/13/2022   Lab Results  Component Value Date   CHOL 106 05/13/2022   HDL 64 05/13/2022   LDLCALC 30 05/13/2022   TRIG 50 05/13/2022   CHOLHDL 2.2 07/17/2020   Lab Results  Component Value Date   VD25OH 42.9 07/06/2022   VD25OH 27.3 05/13/2022   Lab Results  Component Value Date   WBC 4.9 05/13/2022    HGB 14.7 05/13/2022   HCT 44 05/13/2022   PLT 216 05/13/2022   No results found for: "IRON", "TIBC", "FERRITIN"  Attestation Statements:   Reviewed by clinician on day of visit: allergies, medications, problem list, medical history, surgical history, family history, social history, and previous encounter notes.   I, Burt Knack, am acting as transcriptionist for Reuben Likes, MD.  I have reviewed the above documentation for accuracy and completeness, and I agree with the above. - Reuben Likes, MD

## 2023-01-19 ENCOUNTER — Encounter (INDEPENDENT_AMBULATORY_CARE_PROVIDER_SITE_OTHER): Payer: Self-pay | Admitting: Family Medicine

## 2023-01-19 NOTE — Telephone Encounter (Signed)
Please advise 

## 2023-02-02 DIAGNOSIS — G4733 Obstructive sleep apnea (adult) (pediatric): Secondary | ICD-10-CM | POA: Diagnosis not present

## 2023-02-03 NOTE — Telephone Encounter (Signed)
Please advise 

## 2023-02-08 DIAGNOSIS — G4733 Obstructive sleep apnea (adult) (pediatric): Secondary | ICD-10-CM | POA: Diagnosis not present

## 2023-02-22 ENCOUNTER — Ambulatory Visit: Payer: Medicare PPO | Admitting: Adult Health

## 2023-02-28 ENCOUNTER — Ambulatory Visit (INDEPENDENT_AMBULATORY_CARE_PROVIDER_SITE_OTHER): Payer: Medicare PPO | Admitting: Family Medicine

## 2023-02-28 ENCOUNTER — Encounter (INDEPENDENT_AMBULATORY_CARE_PROVIDER_SITE_OTHER): Payer: Self-pay | Admitting: Family Medicine

## 2023-02-28 VITALS — BP 135/72 | HR 71 | Temp 98.2°F | Ht 72.0 in | Wt 265.0 lb

## 2023-02-28 DIAGNOSIS — Z6836 Body mass index (BMI) 36.0-36.9, adult: Secondary | ICD-10-CM

## 2023-02-28 DIAGNOSIS — I10 Essential (primary) hypertension: Secondary | ICD-10-CM

## 2023-02-28 DIAGNOSIS — E669 Obesity, unspecified: Secondary | ICD-10-CM | POA: Diagnosis not present

## 2023-02-28 DIAGNOSIS — F5089 Other specified eating disorder: Secondary | ICD-10-CM | POA: Diagnosis not present

## 2023-02-28 MED ORDER — LOMAIRA 8 MG PO TABS
8.0000 mg | ORAL_TABLET | Freq: Every day | ORAL | 0 refills | Status: DC
Start: 2023-02-28 — End: 2023-04-04

## 2023-02-28 MED ORDER — TOPIRAMATE 50 MG PO TABS: 50.0000 mg | ORAL_TABLET | Freq: Every day | ORAL | 0 refills | Status: DC

## 2023-02-28 NOTE — Progress Notes (Unsigned)
Chief Complaint:   OBESITY Miguel Peterson is here to discuss his progress with his obesity treatment plan along with follow-up of his obesity related diagnoses. Miguel Peterson is on the Category 4 Plan and states he is following his eating plan approximately 75% of the time. Miguel Peterson states he is doing exercises for 40 minutes 6 times per week.  Today's visit was #: 9 Starting weight: 273 lbs Starting date: 07/06/2022 Today's weight: 265 lbs Today's date: 02/28/2023 Total lbs lost to date: 8 Total lbs lost since last in-office visit: 5  Interim History: Since last appointment he traveled and no one in his party got COVID.  He is planning on another trip at end of December and then again in February.  In December he is going on a river trip down the [place].  In February he and his wife are going on a cruise from [place] to [place] and around [place]. Since getting back from trip he has been sticking more to the plan particularly at breakfast and lunch.  Dinner is 50/50 on plan and off plan.  Dinner when he isn't on plan tends to be a smaller portion.  No sure what the medication is doing.   Subjective:   1. Essential hypertension Patient's blood pressure is well-controlled today, even on phentermine.  He denies chest pain, chest pressure, or headache.  2. Other disorder of eating Patient is on combination Lomaira and topiramate.  PDMP was checked with no concerns.  Patient starting weight was 270 pounds, and he is down 5 pounds.  Assessment/Plan:   1. Essential hypertension No change in medication dosage at this time.  2. Other disorder of eating Patient will continue his medications, and we will refill Topamax 50 mg and phentermine 8 mg for 1 month.  - topiramate (TOPAMAX) 50 MG tablet; Take 1 tablet (50 mg total) by mouth daily.  Dispense: 30 tablet; Refill: 0 - Phentermine HCl (LOMAIRA) 8 MG TABS; Take 1 tablet (8 mg total) by mouth daily.  Dispense: 30 tablet; Refill: 0  3. BMI  36.0-36.9,adult  4. Obesity with starting BMI of 37.1 Miguel Peterson is currently in the action stage of change. As such, his goal is to continue with weight loss efforts. He has agreed to the Category 4 Plan.   Exercise goals: All adults should avoid inactivity. Some physical activity is better than none, and adults who participate in any amount of physical activity gain some health benefits.  Behavioral modification strategies: increasing lean protein intake, meal planning and cooking strategies, keeping healthy foods in the home, and planning for success.  Miguel Peterson has agreed to follow-up with our clinic in 4 weeks. He was informed of the importance of frequent follow-up visits to maximize his success with intensive lifestyle modifications for his multiple health conditions.   Objective:   Blood pressure 135/72, pulse 71, temperature 98.2 F (36.8 C), height 6' (1.829 m), weight 265 lb (120.2 kg), SpO2 98%. Body mass index is 35.94 kg/m.  General: Cooperative, alert, well developed, in no acute distress. HEENT: Conjunctivae and lids unremarkable. Cardiovascular: Regular rhythm.  Lungs: Normal work of breathing. Neurologic: No focal deficits.   Lab Results  Component Value Date   CREATININE 1.1 05/13/2022   BUN 17 05/13/2022   NA 142 05/13/2022   K 4.2 05/13/2022   CL 106 05/13/2022   CO2 24 (A) 05/13/2022   Lab Results  Component Value Date   ALT 32 05/13/2022   AST 25 05/13/2022   ALKPHOS  99 05/13/2022   BILITOT 0.8 07/17/2020   Lab Results  Component Value Date   HGBA1C 6.0 05/13/2022   Lab Results  Component Value Date   INSULIN 11.0 07/06/2022   Lab Results  Component Value Date   TSH 3.66 05/13/2022   Lab Results  Component Value Date   CHOL 106 05/13/2022   HDL 64 05/13/2022   LDLCALC 30 05/13/2022   TRIG 50 05/13/2022   CHOLHDL 2.2 07/17/2020   Lab Results  Component Value Date   VD25OH 42.9 07/06/2022   VD25OH 27.3 05/13/2022   Lab Results  Component  Value Date   WBC 4.9 05/13/2022   HGB 14.7 05/13/2022   HCT 44 05/13/2022   PLT 216 05/13/2022   No results found for: "IRON", "TIBC", "FERRITIN"  Attestation Statements:   Reviewed by clinician on day of visit: allergies, medications, problem list, medical history, surgical history, family history, social history, and previous encounter notes.   I, Burt Knack, am acting as transcriptionist for Reuben Likes, MD.  I have reviewed the above documentation for accuracy and completeness, and I agree with the above. - Reuben Likes, MD

## 2023-03-05 DIAGNOSIS — G4733 Obstructive sleep apnea (adult) (pediatric): Secondary | ICD-10-CM | POA: Diagnosis not present

## 2023-03-16 ENCOUNTER — Ambulatory Visit (INDEPENDENT_AMBULATORY_CARE_PROVIDER_SITE_OTHER): Payer: Medicare PPO | Admitting: Audiology

## 2023-03-16 ENCOUNTER — Encounter (INDEPENDENT_AMBULATORY_CARE_PROVIDER_SITE_OTHER): Payer: Self-pay | Admitting: Otolaryngology

## 2023-03-16 ENCOUNTER — Ambulatory Visit (INDEPENDENT_AMBULATORY_CARE_PROVIDER_SITE_OTHER): Payer: Medicare PPO | Admitting: Otolaryngology

## 2023-03-16 VITALS — Ht 72.0 in | Wt 273.8 lb

## 2023-03-16 DIAGNOSIS — H903 Sensorineural hearing loss, bilateral: Secondary | ICD-10-CM | POA: Diagnosis not present

## 2023-03-16 DIAGNOSIS — H6123 Impacted cerumen, bilateral: Secondary | ICD-10-CM

## 2023-03-16 NOTE — Progress Notes (Signed)
Patient ID: Miguel Peterson, male   DOB: 07-18-1950, 72 y.o.   MRN: [id number]  Follow-up: Hearing loss, cerumen impaction  HPI: The patient is a 72 year old male who returns today for his follow-up evaluation.  He was last seen 1 year ago for his progressive hearing loss.  He was also noted to have bilateral cerumen impaction.  He was treated with cerumen disimpaction and hearing amplification.  The patient returns today reporting no significant difficulty.  He is still having difficulty hearing, especially in noisy environments.  He did not keep his hearing aids.  Currently he denies any otalgia, otorrhea, or vertigo.  Exam: General: Communicates without difficulty, well nourished, no acute distress. Head: Normocephalic, no evidence injury, no tenderness, facial buttresses intact without stepoff. Face/sinus: No tenderness to palpation and percussion. Facial movement is normal and symmetric. Eyes: PERRL, EOMI. No scleral icterus, conjunctivae clear. Neuro: CN II exam reveals vision grossly intact.  No nystagmus at any point of gaze. EAC: Bilateral cerumen impaction.  Under the operating microscope, the cerumen is carefully removed with a combination of cerumen currette, alligator forceps, and suction catheters.  After the cerumen is removed, the TMs are noted to be normal. Nose: External evaluation reveals normal support and skin without lesions.  Dorsum is intact.  Anterior rhinoscopy reveals pink mucosa over anterior aspect of inferior turbinates and intact septum.  No purulence noted. Oral:  Oral cavity and oropharynx are intact, symmetric, without erythema or edema.  Mucosa is moist without lesions. Neck: Full range of motion without pain.  There is no significant lymphadenopathy.  No masses palpable.  Thyroid bed within normal limits to palpation.  Parotid glands and submandibular glands equal bilaterally without mass.  Trachea is midline. Neuro:  CN 2-12 grossly intact. Gait normal.   Procedure:  Bilateral cerumen disimpaction Anesthesia: None Description: Under the operating microscope, the cerumen is carefully removed with a combination of cerumen currette, alligator forceps, and suction catheters.  After the cerumen is removed, the TMs are noted to be normal.  No mass, erythema, or lesions. The patient tolerated the procedure well.   AUDIOMETRIC TESTING: I have read and reviewed the audiometric test, which shows bilateral high-frequency sensorineural hearing loss. The speech reception threshold is 15dB AD and 15dB AS. The discrimination score is 96% AD and 96% AS.   Assessment: Bilateral cerumen impaction.  After the disimpaction procedure, both tympanic membranes and middle ear spaces are noted to be normal.  Stable bilateral high frquency sensorineural hearing loss, slightly worse on the left side.   Plan: 1.  Otomicroscopy with bilateral cerumen disimpaction.  2.  The physical exam findings and the hearing test results are reviewed with the patient.  3.  The patient will return for re-evaluation in 1 year.

## 2023-03-16 NOTE — Progress Notes (Signed)
[facility] [address], [address] [place], [place] [address]   Audiological Evaluation    History: Miguel Peterson was referred today for a hearing evaluation by Dr. Janeece Riggers Philomena Doheny. He was previously seen at [facility] for an audiological evaluation on October 20th, 2023.   Tympanogram: Right ear: Could not obtain seal; middle ear status cannot be determined at this time. Left ear: Could not obtain seal; middle ear status cannot be determined at this time.   Hearing Evaluation: The audiogram was completed using conventional audiometric techniques under headphones with good reliability.   The hearing test results indicate: Right ear: Normal hearing sensitivity from [phone] Hz sloping to moderate sensorineural hearing loss from 3000-8000 Hz. Left ear: Normal hearing sensitivity from [phone] Hz sloping to moderately-severe sensorineural hearing loss from 3000-8000 Hz.   Speech Recognition Thresholds were obtained at 15 dBHL in the right ear and 15 dBHL in the left ear.   Word Recognition Testing was completed using the NU-6 word lists at 55 dBHL in the right ear and at 55 dBHL in the left ear and the patient scored 96% in the right ear and 96% in the left ear.  Today's test results do not suggest a significant change in hearing and word recognition scores when compared to the previous audiogram.   Recommendations: Repeat audiogram when changes are perceived or per MD. Patient is a candidate for hearing amplification, consider a hearing aid evaluation.   Conley Rolls Braylynn Ghan, AUD, CCC-A 03/16/23

## 2023-04-04 ENCOUNTER — Encounter (INDEPENDENT_AMBULATORY_CARE_PROVIDER_SITE_OTHER): Payer: Self-pay | Admitting: Family Medicine

## 2023-04-04 ENCOUNTER — Ambulatory Visit (INDEPENDENT_AMBULATORY_CARE_PROVIDER_SITE_OTHER): Payer: Medicare PPO | Admitting: Family Medicine

## 2023-04-04 VITALS — BP 142/67 | HR 74 | Temp 98.3°F | Ht 72.0 in | Wt 265.0 lb

## 2023-04-04 DIAGNOSIS — F5089 Other specified eating disorder: Secondary | ICD-10-CM

## 2023-04-04 DIAGNOSIS — G4733 Obstructive sleep apnea (adult) (pediatric): Secondary | ICD-10-CM | POA: Diagnosis not present

## 2023-04-04 DIAGNOSIS — Z6836 Body mass index (BMI) 36.0-36.9, adult: Secondary | ICD-10-CM

## 2023-04-04 DIAGNOSIS — E669 Obesity, unspecified: Secondary | ICD-10-CM | POA: Diagnosis not present

## 2023-04-04 DIAGNOSIS — Z23 Encounter for immunization: Secondary | ICD-10-CM | POA: Diagnosis not present

## 2023-04-04 DIAGNOSIS — I10 Essential (primary) hypertension: Secondary | ICD-10-CM | POA: Diagnosis not present

## 2023-04-04 MED ORDER — LOMAIRA 8 MG PO TABS
8.0000 mg | ORAL_TABLET | Freq: Every day | ORAL | 0 refills | Status: DC
Start: 2023-04-04 — End: 2023-05-03

## 2023-04-04 MED ORDER — TOPIRAMATE 50 MG PO TABS
50.0000 mg | ORAL_TABLET | Freq: Every day | ORAL | 0 refills | Status: DC
Start: 2023-04-04 — End: 2023-05-03

## 2023-04-04 NOTE — Progress Notes (Unsigned)
Chief Complaint:   OBESITY Miguel Peterson is here to discuss his progress with his obesity treatment plan along with follow-up of his obesity related diagnoses. [name] is on the Category 4 Plan and states he is following his eating plan approximately 65% of the time. [name] states he is doing 0 minutes 0 times per week.  Today's visit was #: 10 Starting weight: 273 lbs Starting date: 07/06/2022 Today's weight: 265 lbs Today's date: 04/04/2023 Total lbs lost to date: 8 Total lbs lost since last in-office visit: 0  Interim History: Patient had a good last few weeks. Mentions he is somewhat following meal plan but not as much as he could be. He does wonder about what the medications he is taking what they are doing for him.  No plans for the month of November- planning to stay at home.   Subjective:   1. Essential hypertension Patient's blood pressure is slightly elevated today.  He denies chest pain, chest pressure, or headache.  2. Other disorder of eating Patient is on Lomaira and topiramate.  PDMP was checked with no concerns.  Patient is noticing some control with eating with these medications.  Assessment/Plan:   1. Essential hypertension Patient will continue Cozaar at the same dose.  We will follow-up on his blood pressure at his next appointment.  2. Other disorder of eating We will refill Lomaira 8 mg and topiramate 50 mg daily for 1 month.  - topiramate (TOPAMAX) 50 MG tablet; Take 1 tablet (50 mg total) by mouth daily.  Dispense: 30 tablet; Refill: 0 - Phentermine HCl (LOMAIRA) 8 MG TABS; Take 1 tablet (8 mg total) by mouth daily.  Dispense: 30 tablet; Refill: 0  3. BMI 36.0-36.9,adult  4. Obesity with starting BMI of 37.1 Tou is currently in the action stage of change. As such, his goal is to continue with weight loss efforts. He has agreed to the Category 4 Plan.   Exercise goals: All adults should avoid inactivity. Some physical activity is better than none, and  adults who participate in any amount of physical activity gain some health benefits.  Behavioral modification strategies: increasing lean protein intake, meal planning and cooking strategies, keeping healthy foods in the home, and planning for success.  [name] has agreed to follow-up with our clinic in 4 weeks. He was informed of the importance of frequent follow-up visits to maximize his success with intensive lifestyle modifications for his multiple health conditions.   Objective:   Blood pressure (!) 142/67, pulse 74, temperature 98.3 F (36.8 C), height 6' (1.829 m), weight 265 lb (120.2 kg), SpO2 97%. Body mass index is 35.94 kg/m.  General: Cooperative, alert, well developed, in no acute distress. HEENT: Conjunctivae and lids unremarkable. Cardiovascular: Regular rhythm.  Lungs: Normal work of breathing. Neurologic: No focal deficits.   Lab Results  Component Value Date   CREATININE 1.1 05/13/2022   BUN 17 05/13/2022   NA 142 05/13/2022   K 4.2 05/13/2022   CL 106 05/13/2022   CO2 24 (A) 05/13/2022   Lab Results  Component Value Date   ALT 32 05/13/2022   AST 25 05/13/2022   ALKPHOS 99 05/13/2022   BILITOT 0.8 07/17/2020   Lab Results  Component Value Date   HGBA1C 6.0 05/13/2022   Lab Results  Component Value Date   INSULIN 11.0 07/06/2022   Lab Results  Component Value Date   TSH 3.66 05/13/2022   Lab Results  Component Value Date   CHOL 106  05/13/2022   HDL 64 05/13/2022   LDLCALC 30 05/13/2022   TRIG 50 05/13/2022   CHOLHDL 2.2 07/17/2020   Lab Results  Component Value Date   VD25OH 42.9 07/06/2022   VD25OH 27.3 05/13/2022   Lab Results  Component Value Date   WBC 4.9 05/13/2022   HGB 14.7 05/13/2022   HCT 44 05/13/2022   PLT 216 05/13/2022   No results found for: "IRON", "TIBC", "FERRITIN"  Attestation Statements:   Reviewed by clinician on day of visit: allergies, medications, problem list, medical history, surgical history, family  history, social history, and previous encounter notes.   I, Burt Knack, am acting as transcriptionist for Reuben Likes, MD. I have reviewed the above documentation for accuracy and completeness, and I agree with the above. - Reuben Likes, MD

## 2023-04-28 ENCOUNTER — Encounter (INDEPENDENT_AMBULATORY_CARE_PROVIDER_SITE_OTHER): Payer: Self-pay

## 2023-05-02 ENCOUNTER — Ambulatory Visit (INDEPENDENT_AMBULATORY_CARE_PROVIDER_SITE_OTHER): Payer: Medicare PPO | Admitting: Family Medicine

## 2023-05-03 ENCOUNTER — Ambulatory Visit (INDEPENDENT_AMBULATORY_CARE_PROVIDER_SITE_OTHER): Payer: Medicare PPO | Admitting: Family Medicine

## 2023-05-03 ENCOUNTER — Encounter (INDEPENDENT_AMBULATORY_CARE_PROVIDER_SITE_OTHER): Payer: Self-pay | Admitting: Family Medicine

## 2023-05-03 VITALS — BP 114/65 | HR 67 | Temp 97.4°F | Ht 72.0 in | Wt 265.0 lb

## 2023-05-03 DIAGNOSIS — E669 Obesity, unspecified: Secondary | ICD-10-CM | POA: Diagnosis not present

## 2023-05-03 DIAGNOSIS — I10 Essential (primary) hypertension: Secondary | ICD-10-CM

## 2023-05-03 DIAGNOSIS — Z6836 Body mass index (BMI) 36.0-36.9, adult: Secondary | ICD-10-CM

## 2023-05-03 DIAGNOSIS — F5089 Other specified eating disorder: Secondary | ICD-10-CM

## 2023-05-03 DIAGNOSIS — F509 Eating disorder, unspecified: Secondary | ICD-10-CM | POA: Insufficient documentation

## 2023-05-03 MED ORDER — TOPIRAMATE 50 MG PO TABS
75.0000 mg | ORAL_TABLET | Freq: Every day | ORAL | 0 refills | Status: DC
Start: 2023-05-03 — End: 2023-05-26

## 2023-05-03 MED ORDER — LOMAIRA 8 MG PO TABS
12.0000 mg | ORAL_TABLET | Freq: Every day | ORAL | 0 refills | Status: DC
Start: 2023-05-03 — End: 2023-05-26

## 2023-05-03 NOTE — Assessment & Plan Note (Addendum)
Patient started on makeshift qsymia on 02/01/23 at 270lbs.  Now down 5lbs. Currently at 1st treatment dose of 8/50.  Will increase to 12/75 to get hopefully get closer to 5% loss. New prescription sent in and patient to follow up in 4 weeks.

## 2023-05-03 NOTE — Progress Notes (Signed)
   SUBJECTIVE:  Chief Complaint: Obesity  Interim History: Patient has been here at home since last appointment.  He is doing about the same food wise.  Getting some of the meal plan in.  He has been consistent with what he has been doing. No plans for travel until right after Christmas.  He and his family are planning to celebrate Thanksgiving. Tends to spend more time with family over the holidays.   Jguadalupe is here to discuss his progress with his obesity treatment plan. He is on the Category 4 Plan and states he is following his eating plan approximately 70 % of the time. He states he is walking 30 minutes 5 times per week.   OBJECTIVE: Visit Diagnoses: Problem List Items Addressed This Visit       Cardiovascular and Mediastinum   High blood pressure    Blood pressure has been well controlled in prior appointments.  Last appointment patient BP was elevated but within normal limits today.  No chest pain, chest pressure or headache.  Follow up on BP at next appointment.        Other   Disorder of eating - Primary    Patient started on makeshift qsymia on 02/01/23 at 270lbs.  Now down 5lbs. Currently at 1st treatment dose of 8/50.  Will increase to 12/75 to get hopefully get closer to 5% loss. New prescription sent in and patient to follow up in 4 weeks.      Relevant Medications   Phentermine HCl (LOMAIRA) 8 MG TABS   topiramate (TOPAMAX) 50 MG tablet   Other Visit Diagnoses     BMI 36.0-36.9,adult       Obesity with starting BMI of 37.1       Relevant Medications   Phentermine HCl (LOMAIRA) 8 MG TABS       No data recorded No data recorded  No data recorded No data recorded    ASSESSMENT AND PLAN:  Diet: Kyngston is currently in the action stage of change. As such, his goal is to continue with weight loss efforts. He has agreed to Category 4 Plan.  Exercise: Tanav has been instructed to try a geriatric exercise plan and that some exercise is better than none for  weight loss and overall health benefits.   Behavior Modification:  We discussed the following Behavioral Modification Strategies today: increasing lean protein intake, increasing vegetables, meal planning and cooking strategies, and keeping healthy foods in the home. We discussed various medication options to help Hue with his weight loss efforts and we both agreed to increase medications to next treatment dose.  No follow-ups on file.Marland Kitchen He was informed of the importance of frequent follow up visits to maximize his success with intensive lifestyle modifications for his multiple health conditions.  Attestation Statements:   Reviewed by clinician on day of visit: allergies, medications, problem list, medical history, surgical history, family history, social history, and previous encounter notes.     Reuben Likes, MD

## 2023-05-05 DIAGNOSIS — G4733 Obstructive sleep apnea (adult) (pediatric): Secondary | ICD-10-CM | POA: Diagnosis not present

## 2023-05-09 DIAGNOSIS — G4733 Obstructive sleep apnea (adult) (pediatric): Secondary | ICD-10-CM | POA: Diagnosis not present

## 2023-05-10 DIAGNOSIS — I10 Essential (primary) hypertension: Secondary | ICD-10-CM | POA: Insufficient documentation

## 2023-05-10 NOTE — Assessment & Plan Note (Signed)
Blood pressure has been well controlled in prior appointments.  Last appointment patient BP was elevated but within normal limits today.  No chest pain, chest pressure or headache.  Follow up on BP at next appointment.

## 2023-05-20 DIAGNOSIS — Z125 Encounter for screening for malignant neoplasm of prostate: Secondary | ICD-10-CM | POA: Diagnosis not present

## 2023-05-20 LAB — LAB REPORT - SCANNED
A1c: 6
EGFR: 63
PSA, Total: 2.21

## 2023-05-26 ENCOUNTER — Ambulatory Visit (INDEPENDENT_AMBULATORY_CARE_PROVIDER_SITE_OTHER): Payer: Medicare PPO | Admitting: Family Medicine

## 2023-05-26 DIAGNOSIS — I10 Essential (primary) hypertension: Secondary | ICD-10-CM

## 2023-05-26 DIAGNOSIS — Z6835 Body mass index (BMI) 35.0-35.9, adult: Secondary | ICD-10-CM

## 2023-05-26 DIAGNOSIS — F5089 Other specified eating disorder: Secondary | ICD-10-CM | POA: Diagnosis not present

## 2023-05-26 DIAGNOSIS — E669 Obesity, unspecified: Secondary | ICD-10-CM | POA: Diagnosis not present

## 2023-05-26 MED ORDER — TOPIRAMATE 50 MG PO TABS
75.0000 mg | ORAL_TABLET | Freq: Every day | ORAL | 0 refills | Status: DC
Start: 2023-05-26 — End: 2023-06-29

## 2023-05-26 MED ORDER — LOMAIRA 8 MG PO TABS
12.0000 mg | ORAL_TABLET | Freq: Every day | ORAL | 0 refills | Status: DC
Start: 2023-05-26 — End: 2023-06-29

## 2023-05-26 NOTE — Progress Notes (Signed)
   SUBJECTIVE:  Chief Complaint: Obesity  Interim History: Patient had a good Thanksgiving.  He stayed home.  He is prepping for his trip that is in a week. He is planning to start packing in the next couple of days. For Christmas day he is celebrating later in the day with his grandson.  He has been trying to stay mindful of food choices.  He is not hungry and is forcing himself to eat. Not feeling much difference in food desire on combo of lomaira and topiramate.   Bransyn is here to discuss his progress with his obesity treatment plan. He is on the Category 4 Plan and states he is following his eating plan approximately 60-65 % of the time. He states he is walking 30 minutes 4 times per week.   OBJECTIVE: Visit Diagnoses: Problem List Items Addressed This Visit       Cardiovascular and Mediastinum   High blood pressure   BP today continues to stay within normal limits (patient is currently on stimulant medication).  No cardiac side effects of medication.  He denies chest pain, chest pressure and headache.  He is on losartan daily.        Other   Disorder of eating   Patient started makeshift qsymia on 02/01/23 at 270lbs.  He is down 7lbs.  No side effects of combination medication noted.  He is on 12mg  phentermine and 75mg  Lomaira.  He isn't sure if he is feeling much in terms of appetite changes on medications.  He needs refills today.PDMP checked today- no concerns noted.      Relevant Medications   Phentermine HCl (LOMAIRA) 8 MG TABS   topiramate (TOPAMAX) 50 MG tablet   Other Visit Diagnoses       Obesity with starting BMI of 37.1    -  Primary   Relevant Medications   Phentermine HCl (LOMAIRA) 8 MG TABS     BMI 35.0-35.9,adult           No data recorded  No data recorded  No data recorded  No data recorded    ASSESSMENT AND PLAN:  Diet: Dariel is currently in the action stage of change. As such, his goal is to continue with weight loss efforts. He has agreed  to Category 4 Plan.  Exercise: Daymeon has been instructed that some exercise is better than none for weight loss and overall health benefits.   Behavior Modification:  We discussed the following Behavioral Modification Strategies today: increasing lean protein intake, increasing vegetables, better snacking choices, travel eating strategies, holiday eating strategies, celebration eating strategies, and planning for success. We discussed various medication options to help Tillmon with his weight loss efforts and we both agreed to continue current dose of lomaira and topiramate at same doses.  No follow-ups on file.Marland Kitchen He was informed of the importance of frequent follow up visits to maximize his success with intensive lifestyle modifications for his multiple health conditions.  Attestation Statements:   Reviewed by clinician on day of visit: allergies, medications, problem list, medical history, surgical history, family history, social history, and previous encounter notes.     Reuben Likes, MD

## 2023-05-30 DIAGNOSIS — R7302 Impaired glucose tolerance (oral): Secondary | ICD-10-CM | POA: Diagnosis not present

## 2023-05-30 DIAGNOSIS — E7800 Pure hypercholesterolemia, unspecified: Secondary | ICD-10-CM | POA: Diagnosis not present

## 2023-05-30 DIAGNOSIS — E663 Overweight: Secondary | ICD-10-CM | POA: Diagnosis not present

## 2023-05-30 DIAGNOSIS — I10 Essential (primary) hypertension: Secondary | ICD-10-CM | POA: Diagnosis not present

## 2023-05-30 DIAGNOSIS — E291 Testicular hypofunction: Secondary | ICD-10-CM | POA: Diagnosis not present

## 2023-05-30 DIAGNOSIS — Z0000 Encounter for general adult medical examination without abnormal findings: Secondary | ICD-10-CM | POA: Diagnosis not present

## 2023-05-30 DIAGNOSIS — I2510 Atherosclerotic heart disease of native coronary artery without angina pectoris: Secondary | ICD-10-CM | POA: Diagnosis not present

## 2023-05-30 DIAGNOSIS — G4733 Obstructive sleep apnea (adult) (pediatric): Secondary | ICD-10-CM | POA: Diagnosis not present

## 2023-05-30 DIAGNOSIS — J4520 Mild intermittent asthma, uncomplicated: Secondary | ICD-10-CM | POA: Diagnosis not present

## 2023-05-30 NOTE — Assessment & Plan Note (Signed)
Patient started makeshift qsymia on 02/01/23 at 270lbs.  He is down 7lbs.  No side effects of combination medication noted.  He is on 12mg  phentermine and 75mg  Lomaira.  He isn't sure if he is feeling much in terms of appetite changes on medications.  He needs refills today.PDMP checked today- no concerns noted.

## 2023-05-30 NOTE — Assessment & Plan Note (Signed)
BP today continues to stay within normal limits (patient is currently on stimulant medication).  No cardiac side effects of medication.  He denies chest pain, chest pressure and headache.  He is on losartan daily.

## 2023-06-04 DIAGNOSIS — G4733 Obstructive sleep apnea (adult) (pediatric): Secondary | ICD-10-CM | POA: Diagnosis not present

## 2023-06-13 LAB — CBC AND DIFFERENTIAL
HCT: 93 — AB (ref 41–53)
Hemoglobin: 15.8 (ref 13.5–17.5)
Platelets: 246 10*3/uL (ref 150–400)

## 2023-06-13 LAB — LIPID PANEL
Cholesterol: 92 (ref 0–200)
HDL: 45 (ref 35–70)
LDL Cholesterol: 30
Triglycerides: 91 (ref 40–160)

## 2023-06-13 LAB — CBC: RBC: 5.27 — AB (ref 3.87–5.11)

## 2023-06-13 LAB — HEPATIC FUNCTION PANEL
ALT: 32 U/L (ref 10–40)
AST: 26 (ref 14–40)
Alkaline Phosphatase: 124 (ref 25–125)
Bilirubin, Total: 0.3

## 2023-06-13 LAB — BASIC METABOLIC PANEL
BUN: 19 (ref 4–21)
CO2: 19 (ref 13–22)
Chloride: 107 (ref 99–108)
Creatinine: 1.2 (ref 0.6–1.3)
Glucose: 110
Potassium: 4.1 meq/L (ref 3.5–5.1)
Sodium: 141 (ref 137–147)

## 2023-06-13 LAB — TSH: TSH: 5.34 (ref 0.41–5.90)

## 2023-06-13 LAB — HEMOGLOBIN A1C: Hemoglobin A1C: 6

## 2023-06-13 LAB — COMPREHENSIVE METABOLIC PANEL
Albumin: 4.3 (ref 3.5–5.0)
Calcium: 9.2 (ref 8.7–10.7)
Globulin: 2

## 2023-06-29 ENCOUNTER — Ambulatory Visit (INDEPENDENT_AMBULATORY_CARE_PROVIDER_SITE_OTHER): Payer: Medicare PPO | Admitting: Family Medicine

## 2023-06-29 ENCOUNTER — Encounter (INDEPENDENT_AMBULATORY_CARE_PROVIDER_SITE_OTHER): Payer: Self-pay | Admitting: Family Medicine

## 2023-06-29 VITALS — BP 118/66 | HR 83 | Temp 97.5°F | Ht 72.0 in | Wt 257.0 lb

## 2023-06-29 DIAGNOSIS — G4733 Obstructive sleep apnea (adult) (pediatric): Secondary | ICD-10-CM | POA: Diagnosis not present

## 2023-06-29 DIAGNOSIS — Z6834 Body mass index (BMI) 34.0-34.9, adult: Secondary | ICD-10-CM | POA: Diagnosis not present

## 2023-06-29 DIAGNOSIS — F5089 Other specified eating disorder: Secondary | ICD-10-CM | POA: Diagnosis not present

## 2023-06-29 DIAGNOSIS — E66812 Obesity, class 2: Secondary | ICD-10-CM

## 2023-06-29 MED ORDER — LOMAIRA 8 MG PO TABS
12.0000 mg | ORAL_TABLET | Freq: Every day | ORAL | 0 refills | Status: DC
Start: 2023-06-29 — End: 2023-08-03

## 2023-06-29 MED ORDER — TOPIRAMATE 50 MG PO TABS: 75.0000 mg | ORAL_TABLET | Freq: Every day | ORAL | 0 refills | Status: DC

## 2023-06-29 NOTE — Progress Notes (Signed)
SUBJECTIVE:  Chief Complaint: Obesity  Interim History: Patient returned from [place] and then a few days later tested positive for influenza.  He took tamiflu but is still getting over the symptoms of influenza.  He lost his appetite while ill.  Enjoyed his trip. Appetite is still not where it was previously due to sinus drainage.    Miguel Peterson is here to discuss his progress with his obesity treatment plan. He is on the Category 4 Plan and states he is following his eating plan approximately 25 % of the time. He states he is not exercising.   OBJECTIVE: Visit Diagnoses: Problem List Items Addressed This Visit       Respiratory   OSA (obstructive sleep apnea)   Sleep study reviewed today.  Patient's AHI was 36.5 placing him in the severe sleep apnea zone.  RDI 36.6/h, minimal oxygen saturation of 83%, average oxygen saturation 95%.  Discussed recent FDA approval of the medication of Zepbound for treatment of obstructive sleep apnea in concurrence with CPAP therapy.  Patient is interested in pursuing this medication after he returns from his upcoming trip.  We discussed risks, benefits, side effects.  Through mutual decision making patient and I will likely start to taper his medications over the next few weeks in anticipation of starting Zepbound at next appointment.        Other   Disorder of eating - Primary   Patient started makeshift qsymia on 02/01/23 at 270lbs. current weight is 257 pounds.  No side effects of combination medication noted.  He is on 12mg  phentermine and 75mg  Lomaira. PDMP checked today- no concerns noted.  He needs a refill today.  We discussed tapering off these medications in anticipation for starting Zepbound for treatment of his severe sleep apnea.  He understands that he will need to decrease medication dosages over the course of 3 weeks before totally stopping.  Cessation protocol of 1 week 8 mg low Mira, 50 mg topiramate to then be decreased after 1 week to 4 mg  Lemaire, 25 mg topiramate discussed with patient.      Relevant Medications   Phentermine HCl (LOMAIRA) 8 MG TABS   topiramate (TOPAMAX) 50 MG tablet   Class 2 severe obesity with serious comorbidity and body mass index (BMI) of 37.0 to 37.9 in adult [facility])   Starting weight of 273 pounds at starting program date of July 06, 2022.  Patient is now down to 257 pounds.  He has had numerous trips which have led to less control in his food intake.  He had previously started combination phentermine and topiramate to help with his obesity.  He is not noticing much in terms of changes in satiety, or appetite control.  He has another upcoming trip.  When traveling patient does the best to make mindful decisions when he is able.      Relevant Medications   Phentermine HCl (LOMAIRA) 8 MG TABS   Other Visit Diagnoses       Obesity with starting BMI of 37.1       Relevant Medications   Phentermine HCl (LOMAIRA) 8 MG TABS     BMI 34.0-34.9,adult           No data recorded No data recorded  No data recorded No data recorded     06/29/2023   11:00 AM 05/26/2023   10:00 AM 05/03/2023    8:00 AM  Vitals with BMI  Height 6\' 0"  6\' 0"  6\' 0"   Weight 257  lbs 263 lbs 265 lbs  BMI 34.85 35.66 35.93  Systolic 118 122 161  Diastolic 66 68 65  Pulse 83 68 67     ASSESSMENT AND PLAN:  Diet: Miguel Peterson is currently in the action stage of change. As such, his goal is to continue with weight loss efforts. He has agreed to Category 4 Plan.  Exercise: Miguel Peterson has been instructed that some exercise is better than none for weight loss and overall health benefits.   Behavior Modification:  We discussed the following Behavioral Modification Strategies today: increasing lean protein intake, increasing vegetables, no skipping meals, meal planning and cooking strategies, better snacking choices, and planning for success. We discussed various medication options to help Miguel Peterson with his weight loss efforts and we  both agreed to continue phentermine and topiramate daily.  No follow-ups on file.Marland Kitchen He was informed of the importance of frequent follow up visits to maximize his success with intensive lifestyle modifications for his multiple health conditions.  Attestation Statements:   Reviewed by clinician on day of visit: allergies, medications, problem list, medical history, surgical history, family history, social history, and previous encounter notes.    Reuben Likes, MD

## 2023-07-05 DIAGNOSIS — G4733 Obstructive sleep apnea (adult) (pediatric): Secondary | ICD-10-CM | POA: Diagnosis not present

## 2023-07-06 DIAGNOSIS — E66812 Obesity, class 2: Secondary | ICD-10-CM | POA: Insufficient documentation

## 2023-07-06 NOTE — Assessment & Plan Note (Signed)
Starting weight of 273 pounds at starting program date of July 06, 2022.  Patient is now down to 257 pounds.  He has had numerous trips which have led to less control in his food intake.  He had previously started combination phentermine and topiramate to help with his obesity.  He is not noticing much in terms of changes in satiety, or appetite control.  He has another upcoming trip.  When traveling patient does the best to make mindful decisions when he is able.

## 2023-07-06 NOTE — Assessment & Plan Note (Signed)
Patient started makeshift qsymia on 02/01/23 at 270lbs. current weight is 257 pounds.  No side effects of combination medication noted.  He is on 12mg  phentermine and 75mg  Lomaira. PDMP checked today- no concerns noted.  He needs a refill today.  We discussed tapering off these medications in anticipation for starting Zepbound for treatment of his severe sleep apnea.  He understands that he will need to decrease medication dosages over the course of 3 weeks before totally stopping.  Cessation protocol of 1 week 8 mg low Mira, 50 mg topiramate to then be decreased after 1 week to 4 mg Lemaire, 25 mg topiramate discussed with patient.

## 2023-07-06 NOTE — Assessment & Plan Note (Signed)
Sleep study reviewed today.  Patient's AHI was 36.5 placing him in the severe sleep apnea zone.  RDI 36.6/h, minimal oxygen saturation of 83%, average oxygen saturation 95%.  Discussed recent FDA approval of the medication of Zepbound for treatment of obstructive sleep apnea in concurrence with CPAP therapy.  Patient is interested in pursuing this medication after he returns from his upcoming trip.  We discussed risks, benefits, side effects.  Through mutual decision making patient and I will likely start to taper his medications over the next few weeks in anticipation of starting Zepbound at next appointment.

## 2023-08-02 ENCOUNTER — Other Ambulatory Visit (INDEPENDENT_AMBULATORY_CARE_PROVIDER_SITE_OTHER): Payer: Self-pay

## 2023-08-03 ENCOUNTER — Encounter (INDEPENDENT_AMBULATORY_CARE_PROVIDER_SITE_OTHER): Payer: Self-pay | Admitting: Family Medicine

## 2023-08-03 ENCOUNTER — Ambulatory Visit (INDEPENDENT_AMBULATORY_CARE_PROVIDER_SITE_OTHER): Payer: Medicare PPO | Admitting: Family Medicine

## 2023-08-03 VITALS — BP 113/57 | HR 73 | Temp 98.2°F | Ht 72.0 in | Wt 263.0 lb

## 2023-08-03 DIAGNOSIS — F5089 Other specified eating disorder: Secondary | ICD-10-CM

## 2023-08-03 DIAGNOSIS — Z6837 Body mass index (BMI) 37.0-37.9, adult: Secondary | ICD-10-CM | POA: Diagnosis not present

## 2023-08-03 DIAGNOSIS — E66812 Obesity, class 2: Secondary | ICD-10-CM | POA: Diagnosis not present

## 2023-08-03 DIAGNOSIS — G4733 Obstructive sleep apnea (adult) (pediatric): Secondary | ICD-10-CM

## 2023-08-03 NOTE — Assessment & Plan Note (Signed)
 Anthropometric Measurements Height: 6' (1.829 m) Weight: 263 lb (119.3 kg) BMI (Calculated): 35.66 Weight at Last Visit: 257 lb Weight Lost Since Last Visit: 0 Weight Gained Since Last Visit: 6 Starting Weight: 273 lb Total Weight Loss (lbs): 10 lb (4.536 kg)  Other Clinical Data Today's Visit #: 14 Starting Date: 07/06/22 Comments: Cat 4  Previously on combination phentermine and topiramate starting weight of 270- did not achieve 5%.  Weaned off combination medication

## 2023-08-03 NOTE — Assessment & Plan Note (Signed)
 Sleep study reviewed today.  Patient's AHI was 36.5 placing him in the severe sleep apnea zone.  RDI 36.6/h, minimal oxygen saturation of 83%, average oxygen saturation 95%.  Discussed recent FDA approval of the medication of Zepbound for treatment of obstructive sleep apnea in concurrence with CPAP therapy.  Patient is interested in pursuing this medication after he returns from his upcoming trip.  We discussed risks, benefits, side effects.  Through mutual decision making patient agreed to start Zepbound at 2.5mg  dose.  Prescription sent in.

## 2023-08-03 NOTE — Assessment & Plan Note (Signed)
 Patient weaned combo of phentermine and topiramate since last appointment.  He is not noticing much in terms of rebound hunger or cravings.

## 2023-08-03 NOTE — Progress Notes (Signed)
   SUBJECTIVE:  Chief Complaint: Obesity  Interim History: Patient just got back from a [place] cruise.  Overall the cruise was enjoyable.  His next trip is in April.  His sense of taste has significantly changed since having influenza (he was also on topiramate).  In April he is going on a [place] cruise.    Miguel Peterson is here to discuss his progress with his obesity treatment plan. He is on the Category 4 Plan and states he is following his eating plan approximately 25 % of the time. He states he is not exercising much.   OBJECTIVE: Visit Diagnoses: Problem List Items Addressed This Visit       Respiratory   OSA (obstructive sleep apnea) - Primary   Sleep study reviewed today.  Patient's AHI was 36.5 placing him in the severe sleep apnea zone.  RDI 36.6/h, minimal oxygen saturation of 83%, average oxygen saturation 95%.  Discussed recent FDA approval of the medication of Zepbound for treatment of obstructive sleep apnea in concurrence with CPAP therapy.  Patient is interested in pursuing this medication after he returns from his upcoming trip.  We discussed risks, benefits, side effects.  Through mutual decision making patient agreed to start Zepbound at 2.5mg  dose.  Prescription sent in.        Other   Disorder of eating   Patient weaned combo of phentermine and topiramate since last appointment.  He is not noticing much in terms of rebound hunger or cravings.      Class 2 severe obesity with serious comorbidity and body mass index (BMI) of 37.0 to 37.9 in adult [facility])   Anthropometric Measurements Height: 6' (1.829 m) Weight: 263 lb (119.3 kg) BMI (Calculated): 35.66 Weight at Last Visit: 257 lb Weight Lost Since Last Visit: 0 Weight Gained Since Last Visit: 6 Starting Weight: 273 lb Total Weight Loss (lbs): 10 lb (4.536 kg)  Other Clinical Data Today's Visit #: 14 Starting Date: 07/06/22 Comments: Cat 4  Previously on combination phentermine and topiramate starting  weight of 270- did not achieve 5%.  Weaned off combination medication        Vitals Temp: 98.2 F (36.8 C) BP: (!) 113/57 Pulse Rate: 73 SpO2: 98 %    Body Composition  Body Fat %: 35.6 % Fat Mass (lbs): 93.6 lbs Muscle Mass (lbs): 161.2 lbs Total Body Water (lbs): 120 lbs Visceral Fat Rating : 23      ASSESSMENT AND PLAN:  Diet: Sohil is currently in the action stage of change. As such, [name].   Exercise:  Older adults should determine their level of effort for physical activity relative to their level of fitness.  Patient was encouraged to start movement- any movement for 10-15 minutes 3-4 times a week.  Behavior Modification:  We discussed the following Behavioral Modification Strategies today: increasing lean protein intake, increasing vegetables, meal planning and cooking strategies, travel eating strategies, and planning for success.   No follow-ups on file.Marland Kitchen He was informed of the importance of frequent follow up visits to maximize his success with intensive lifestyle modifications for his multiple health conditions.  Attestation Statements:   Reviewed by clinician on day of visit: allergies, medications, problem list, medical history, surgical history, family history, social history, and previous encounter notes.     Reuben Likes, MD

## 2023-08-11 ENCOUNTER — Encounter (INDEPENDENT_AMBULATORY_CARE_PROVIDER_SITE_OTHER): Payer: Self-pay | Admitting: Family Medicine

## 2023-08-15 ENCOUNTER — Other Ambulatory Visit (INDEPENDENT_AMBULATORY_CARE_PROVIDER_SITE_OTHER): Payer: Self-pay

## 2023-08-15 DIAGNOSIS — G4733 Obstructive sleep apnea (adult) (pediatric): Secondary | ICD-10-CM

## 2023-08-15 MED ORDER — TIRZEPATIDE 2.5 MG/0.5ML ~~LOC~~ SOAJ: 2.5000 mg | SUBCUTANEOUS | 0 refills | Status: DC

## 2023-08-25 ENCOUNTER — Encounter: Payer: Self-pay | Admitting: Internal Medicine

## 2023-09-01 ENCOUNTER — Ambulatory Visit (INDEPENDENT_AMBULATORY_CARE_PROVIDER_SITE_OTHER): Payer: Medicare PPO | Admitting: Family Medicine

## 2023-09-01 ENCOUNTER — Encounter (INDEPENDENT_AMBULATORY_CARE_PROVIDER_SITE_OTHER): Payer: Self-pay | Admitting: Family Medicine

## 2023-09-01 VITALS — BP 128/75 | HR 68 | Temp 97.9°F | Ht 72.0 in | Wt 255.0 lb

## 2023-09-01 DIAGNOSIS — Z6834 Body mass index (BMI) 34.0-34.9, adult: Secondary | ICD-10-CM

## 2023-09-01 DIAGNOSIS — E669 Obesity, unspecified: Secondary | ICD-10-CM | POA: Diagnosis not present

## 2023-09-01 DIAGNOSIS — G4733 Obstructive sleep apnea (adult) (pediatric): Secondary | ICD-10-CM

## 2023-09-01 MED ORDER — TIRZEPATIDE 2.5 MG/0.5ML ~~LOC~~ SOAJ: 2.5000 mg | SUBCUTANEOUS | 0 refills | Status: DC

## 2023-09-01 NOTE — Progress Notes (Signed)
   SUBJECTIVE:  Chief Complaint: Obesity  Interim History: Patient started on tirzepatide since last appointment.  He is not feeling hungry at all.  Me states he is focusing on his protein intake. He is leaving for [place] and [place] in two weeks.   Miguel Peterson is here to discuss his progress with his obesity treatment plan. He is on the Category 4 Plan and states he is following his eating plan approximately 50 % of the time. He states he is exercising 60 minutes 5 times per week.   OBJECTIVE: Visit Diagnoses: Problem List Items Addressed This Visit       Respiratory   OSA (obstructive sleep apnea) - Primary   Patient started on Mounjaro for assistance in managing his OSA in addition to his CPAP.  He needs a refill of mounjaro today- 3 month prescription sent in as patient is traveling.       Relevant Medications   tirzepatide (MOUNJARO) 2.5 MG/0.5ML Pen   Other Visit Diagnoses       Obesity with starting BMI of 37.1       Relevant Medications   tirzepatide (MOUNJARO) 2.5 MG/0.5ML Pen     BMI 34.0-34.9,adult           Vitals Temp: 97.9 F (36.6 C) BP: 128/75 Pulse Rate: 68 SpO2: 99 %   Anthropometric Measurements Height: 6' (1.829 m) Weight: 255 lb (115.7 kg) BMI (Calculated): 34.58 Weight at Last Visit: 263 lb Weight Lost Since Last Visit: 8 Weight Gained Since Last Visit: 0 Starting Weight: 273 lb Total Weight Loss (lbs): 18 lb (8.165 kg)   Body Composition  Body Fat %: 33.3 % Fat Mass (lbs): 85 lbs Muscle Mass (lbs): 161.6 lbs Total Body Water (lbs): 112 lbs Visceral Fat Rating : 21   Other Clinical Data Fasting: no Labs: no Today's Visit #: 15 Starting Date: 07/06/22 Comments: Cat 4     ASSESSMENT AND PLAN:  Diet: Miguel Peterson is currently in the action stage of change. As such, [name] and practicing portion control and making smarter food choices, such as increasing  vegetables and decreasing simple carbohydrates.   Exercise:  Older adults should determine their level of effort for physical activity relative to their level of fitness.  Behavior Modification:  We discussed the following Behavioral Modification Strategies today: increasing lean protein intake, meal planning and cooking strategies, travel eating strategies, avoiding temptations, and planning for success.   Return in about 6 weeks (around 10/13/2023).Marland Kitchen He was informed of the importance of frequent follow up visits to maximize his success with intensive lifestyle modifications for his multiple health conditions.  Attestation Statements:   Reviewed by clinician on day of visit: allergies, medications, problem list, medical history, surgical history, family history, social history, and previous encounter notes.     Miguel Likes, MD

## 2023-09-01 NOTE — Assessment & Plan Note (Signed)
 Patient started on [facility] for assistance in managing his OSA in addition to his CPAP.  He needs a refill of mounjaro today- 3 month prescription sent in as patient is traveling.

## 2023-09-05 ENCOUNTER — Encounter (INDEPENDENT_AMBULATORY_CARE_PROVIDER_SITE_OTHER): Payer: Self-pay | Admitting: Family Medicine

## 2023-09-05 NOTE — Telephone Encounter (Signed)
 FYI

## 2023-10-13 ENCOUNTER — Ambulatory Visit (INDEPENDENT_AMBULATORY_CARE_PROVIDER_SITE_OTHER): Admitting: Family Medicine

## 2023-10-13 ENCOUNTER — Encounter (INDEPENDENT_AMBULATORY_CARE_PROVIDER_SITE_OTHER): Payer: Self-pay | Admitting: Family Medicine

## 2023-10-13 VITALS — BP 128/75 | HR 74 | Temp 98.2°F | Ht 72.0 in | Wt 247.0 lb

## 2023-10-13 DIAGNOSIS — E66812 Obesity, class 2: Secondary | ICD-10-CM | POA: Diagnosis not present

## 2023-10-13 DIAGNOSIS — I10 Essential (primary) hypertension: Secondary | ICD-10-CM | POA: Diagnosis not present

## 2023-10-13 DIAGNOSIS — G4733 Obstructive sleep apnea (adult) (pediatric): Secondary | ICD-10-CM | POA: Diagnosis not present

## 2023-10-13 DIAGNOSIS — Z6833 Body mass index (BMI) 33.0-33.9, adult: Secondary | ICD-10-CM

## 2023-10-13 NOTE — Progress Notes (Signed)
 SUBJECTIVE:  Chief Complaint: Obesity  Interim History: Patient just returned from a cruise to [place], [place] and [place].  He has a very great time and had many great tour guides. He returned about 3 weeks ago and then is planning a trip to [place]  in a month then cruise to [place]  in August.  He is otherwise planning on staying home.  Started tirzepatide since last appointment and only side effect is a small bruise on his right thigh. He is not experiencing any hunger.   Keval is here to discuss his progress with his obesity treatment plan. He is on the Category 4 Plan and states he is following his eating plan approximately 50 % of the time. He states he is exercising 30 minutes 4 times per week.   OBJECTIVE: Visit Diagnoses: Problem List Items Addressed This Visit       Cardiovascular and Mediastinum   High blood pressure   Blood pressure well controlled today.  No chest pain, chest pressure or headache.  He is on cozaar  50mg  daily.  Continue on current medication and current dosage.        Respiratory   OSA (obstructive sleep apnea) - Primary   Patient tolerating mounjaro and CPAP for treatment of his OSA.  No change in either treatment modalities at this time.  Will need to follow up at subsequent appointments to ensure no change in dose of medication is needed.        Other   Class 2 severe obesity with serious comorbidity and body mass index (BMI) of 37.0 to 37.9 in adult [facility])   Anthropometric Measurements Height: 6' (1.829 m) Weight: 247 lb (112 kg) BMI (Calculated): 33.49 Weight at Last Visit: 255 lb Weight Lost Since Last Visit: 8 Starting Weight: 273 lb Total Weight Loss (lbs): 26 lb (11.8 kg) Body Composition  Body Fat %: 33.6 % Fat Mass (lbs): 83.2 lbs Muscle Mass (lbs): 156.4 lbs Total Body Water (lbs): 110.8 lbs Visceral Fat Rating : 21 Other Clinical Data Today's Visit #: 16 Starting Date: 07/06/22 Comments: Cat 4       Other Visit Diagnoses        Essential hypertension         Obesity with starting BMI of 37.1         BMI 33.0-33.9,adult           No data recorded       10/17/2023   10:04 AM 10/13/2023   10:00 AM 09/01/2023   10:00 AM  Vitals with BMI  Height 6\' 0"  6\' 0"  6\' 0"   Weight 245 lbs 247 lbs 255 lbs  BMI 33.22 33.49 34.58  Systolic  128 128  Diastolic  75 75  Pulse  74 68      ASSESSMENT AND PLAN:  Diet: Miguel Peterson is currently in the action stage of change. As such, [name].   Exercise:  Older adults should follow the adult guidelines. When older adults cannot meet the adult guidelines, they should be as physically active as their abilities and conditions will allow.  Behavior Modification:  We discussed the following Behavioral Modification Strategies today: increasing lean protein intake, decreasing simple carbohydrates, meal planning and cooking strategies, keeping healthy foods in the home, travel eating strategies, and planning for success.   Return in about 6 weeks (around 11/24/2023).   He was informed of the importance of frequent follow  up visits to maximize his success with intensive lifestyle modifications for his multiple health conditions.  Attestation Statements:   Reviewed by clinician on day of visit: allergies, medications, problem list, medical history, surgical history, family history, social history, and previous encounter notes.     Donaciano Frizzle, MD

## 2023-10-13 NOTE — Assessment & Plan Note (Signed)
 Blood pressure well controlled today.  No chest pain, chest pressure or headache.  He is on cozaar  50mg  daily.  Continue on current medication and current dosage.

## 2023-10-17 ENCOUNTER — Ambulatory Visit (AMBULATORY_SURGERY_CENTER)

## 2023-10-17 ENCOUNTER — Encounter: Payer: Self-pay | Admitting: Internal Medicine

## 2023-10-17 VITALS — Ht 72.0 in | Wt 245.0 lb

## 2023-10-17 DIAGNOSIS — Z860100 Personal history of colon polyps, unspecified: Secondary | ICD-10-CM

## 2023-10-17 NOTE — Progress Notes (Signed)

## 2023-10-24 NOTE — Assessment & Plan Note (Signed)
 Anthropometric Measurements Height: 6' (1.829 m) Weight: 247 lb (112 kg) BMI (Calculated): 33.49 Weight at Last Visit: 255 lb Weight Lost Since Last Visit: 8 Starting Weight: 273 lb Total Weight Loss (lbs): 26 lb (11.8 kg) Body Composition  Body Fat %: 33.6 % Fat Mass (lbs): 83.2 lbs Muscle Mass (lbs): 156.4 lbs Total Body Water (lbs): 110.8 lbs Visceral Fat Rating : 21 Other Clinical Data Today's Visit #: 16 Starting Date: 07/06/22 Comments: Cat 4

## 2023-10-24 NOTE — Assessment & Plan Note (Signed)
 Patient tolerating mounjaro and CPAP for treatment of his OSA.  No change in either treatment modalities at this time.  Will need to follow up at subsequent appointments to ensure no change in dose of medication is needed.

## 2023-11-06 DIAGNOSIS — G4733 Obstructive sleep apnea (adult) (pediatric): Secondary | ICD-10-CM | POA: Diagnosis not present

## 2023-11-06 NOTE — Progress Notes (Unsigned)
 [facility] History and Physical   Primary Care Physician:  Candise [facility], MD   Reason for Procedure:  History of adenomatous colon polyps  Plan:    Colonoscopy     HPI: Miguel Peterson is a 73 y.o. male status post removal of 4 small adenomas in 2020, presenting for a surveillance colonoscopy exam.   Past Medical History:  Diagnosis Date   High blood pressure    High cholesterol    Hx of adenomatous colonic polyps 12/12/2018    Past Surgical History:  Procedure Laterality Date   COLONOSCOPY     CYST REMOVAL NECK     benign/ long time ago per pt.   TONSILLECTOMY     as a child    Prior to Admission medications   Medication Sig Start Date End Date Taking? Authorizing Provider  albuterol (VENTOLIN HFA) 108 (90 Base) MCG/ACT inhaler Inhale into the lungs. 06/14/23   [provider]  ezetimibe  (ZETIA ) 10 MG tablet Take 1 tablet (10 mg total) by mouth daily. 12/11/19 10/13/23  Wendie Hamburg, MD  ezetimibe  (ZETIA ) 10 MG tablet Take 10 mg by mouth daily.    [provider]  losartan  (COZAAR ) 50 MG tablet Take 1 tablet (50 mg total) by mouth daily. 07/08/20 10/13/23  Wendie Hamburg, MD  losartan  (COZAAR ) 50 MG tablet Take 50 mg by mouth daily.    [provider]  Multiple Vitamins-Minerals (CENTRUM SILVER 50+MEN PO) Take by mouth.    [provider]  Omega-3 Fatty Acids (FISH OIL) 1200 MG CAPS Take by mouth.    [provider]  ondansetron (ZOFRAN-ODT) 4 MG disintegrating tablet Take 2 mg by mouth every 6 (six) hours as needed. 05/30/23   [provider]  scopolamine (TRANSDERM-SCOP) 1 MG/3DAYS 1 patch every 3 (three) days. 07/07/23   [provider]  sildenafil (REVATIO) 20 MG tablet Take 20 mg by mouth 3 (three) times daily.    [provider]  simvastatin (ZOCOR) 40 MG tablet Take 40 mg by mouth daily.    [provider]  tirzepatide Florence Hunt) 2.5 MG/0.5ML Pen Inject 2.5 mg  into the skin once a week. 09/01/23   Jenean Minus, MD  Vitamin D , Cholecalciferol, 25 MCG (1000 UT) CAPS Take 25 mcg by mouth.    [provider]    Current Outpatient Medications  Medication Sig Dispense Refill   albuterol (VENTOLIN HFA) 108 (90 Base) MCG/ACT inhaler Inhale into the lungs.     ezetimibe  (ZETIA ) 10 MG tablet Take 1 tablet (10 mg total) by mouth daily. 90 tablet 3   ezetimibe  (ZETIA ) 10 MG tablet Take 10 mg by mouth daily.     losartan  (COZAAR ) 50 MG tablet Take 1 tablet (50 mg total) by mouth daily. 90 tablet 3   losartan  (COZAAR ) 50 MG tablet Take 50 mg by mouth daily.     Multiple Vitamins-Minerals (CENTRUM SILVER 50+MEN PO) Take by mouth.     Omega-3 Fatty Acids (FISH OIL) 1200 MG CAPS Take by mouth.     ondansetron (ZOFRAN-ODT) 4 MG disintegrating tablet Take 2 mg by mouth every 6 (six) hours as needed.     scopolamine (TRANSDERM-SCOP) 1 MG/3DAYS 1 patch every 3 (three) days.     sildenafil (REVATIO) 20 MG tablet Take 20 mg by mouth 3 (three) times daily.     simvastatin (ZOCOR) 40 MG tablet Take 40 mg by mouth daily.     tirzepatide (MOUNJARO) 2.5 MG/0.5ML Pen Inject 2.5  mg into the skin once a week. 6 mL 0   Vitamin D , Cholecalciferol, 25 MCG (1000 UT) CAPS Take 25 mcg by mouth.     Current Facility-Administered Medications  Medication Dose Route Frequency Provider Last Rate Last Admin   0.9 %  sodium chloride  infusion  500 mL Intravenous Once Kenney Peacemaker, MD        Allergies as of 11/07/2023   (No Known Allergies)    Family History  Problem Relation Age of Onset   Heart disease Father    Sudden death Father    Obesity Father    Healthy Sister    Healthy Sister    Healthy Brother    Healthy Brother    Colon cancer Neg Hx    Colon polyps Neg Hx    Esophageal cancer Neg Hx    Rectal cancer Neg Hx    Stomach cancer Neg Hx     Social History   Socioeconomic History   Marital status: Married    Spouse name: Not on file   Number  of children: Not on file   Years of education: Not on file   Highest education level: Not on file  Occupational History   Occupation: Photographer:  [employer]  Tobacco Use   Smoking status: Never   Smokeless tobacco: Never  Vaping Use   Vaping status: Never Used  Substance and Sexual Activity   Alcohol use: Yes    Comment: 24 oz of beer   Drug use: Never   Sexual activity: Not on file  Other Topics Concern   Not on file  Social History Narrative   Not on file   Social Drivers of Health   Financial Resource Strain: Not on file  Food Insecurity: Not on file  Transportation Needs: Not on file  Physical Activity: Not on file  Stress: Not on file  Social Connections: Not on file  Intimate Partner Violence: Not on file    Review of Systems: Positive for *** All other review of systems negative except as mentioned in the HPI.  Physical Exam: Vital signs There were no vitals taken for this visit.  General:   Alert,  Well-developed, well-nourished, pleasant and cooperative in NAD Lungs:  Clear throughout to auscultation.   Heart:  Regular rate and rhythm; no murmurs, clicks, rubs,  or gallops. Abdomen:  Soft, nontender and nondistended. Normal bowel sounds.   Neuro/Psych:  Alert and cooperative. Normal mood and affect. A and O x 3   @Shivani Barrantes  Tammie Fall, MD, [facility] [phone] (pager) 11/06/2023 11:41 AM@

## 2023-11-07 ENCOUNTER — Encounter: Payer: Self-pay | Admitting: Internal Medicine

## 2023-11-07 ENCOUNTER — Ambulatory Visit (AMBULATORY_SURGERY_CENTER): Admitting: Internal Medicine

## 2023-11-07 VITALS — BP 127/70 | HR 66 | Temp 98.0°F | Resp 14 | Ht 72.0 in | Wt 245.0 lb

## 2023-11-07 DIAGNOSIS — Z1211 Encounter for screening for malignant neoplasm of colon: Secondary | ICD-10-CM | POA: Diagnosis not present

## 2023-11-07 DIAGNOSIS — Z860101 Personal history of adenomatous and serrated colon polyps: Secondary | ICD-10-CM

## 2023-11-07 DIAGNOSIS — K562 Volvulus: Secondary | ICD-10-CM

## 2023-11-07 DIAGNOSIS — Z860100 Personal history of colon polyps, unspecified: Secondary | ICD-10-CM

## 2023-11-07 DIAGNOSIS — K648 Other hemorrhoids: Secondary | ICD-10-CM | POA: Diagnosis not present

## 2023-11-07 DIAGNOSIS — K5730 Diverticulosis of large intestine without perforation or abscess without bleeding: Secondary | ICD-10-CM | POA: Diagnosis not present

## 2023-11-07 MED ORDER — SODIUM CHLORIDE 0.9 % IV SOLN: 500.0000 mL | INTRAVENOUS | Status: DC

## 2023-11-07 NOTE — Op Note (Signed)
 [facility] Patient Name: Miguel Peterson Procedure Date: 11/07/2023 11:10 AM MRN: [id number] Endoscopist: Kenney Peacemaker , MD, [id number] Age: 73 Referring MD:  Date of Birth: 1951-03-25 Gender: Male Account #: [id number] Procedure:                Colonoscopy Indications:              Surveillance: Personal history of adenomatous                            polyps on last colonoscopy 5 years ago, Last                            colonoscopy: 2020 Medicines:                Monitored Anesthesia Care Procedure:                Pre-Anesthesia Assessment:                           - Prior to the procedure, a History and Physical                            was performed, and patient medications and                            allergies were reviewed. The patient's tolerance of                            previous anesthesia was also reviewed. The risks                            and benefits of the procedure and the sedation                            options and risks were discussed with the patient.                            All questions were answered, and informed consent                            was obtained. Prior Anticoagulants: The patient has                            taken no anticoagulant or antiplatelet agents. ASA                            Grade Assessment: II - A patient with mild systemic                            disease. After reviewing the risks and benefits,                            the patient was deemed in satisfactory condition to  undergo the procedure.                           After obtaining informed consent, the colonoscope                            was passed under direct vision. Throughout the                            procedure, the patient's blood pressure, pulse, and                            oxygen saturations were monitored continuously. The                            CF [id number] #[id number] was introduced through the anus                             and advanced to the the cecum, identified by                            appendiceal orifice and ileocecal valve. The                            colonoscopy was somewhat difficult due to                            significant looping. Successful completion of the                            procedure was aided by applying abdominal pressure                            (transverse). The patient tolerated the procedure                            well. The quality of the bowel preparation was                            adequate. The ileocecal valve, appendiceal orifice,                            and rectum were photographed. The bowel preparation                            used was Miralax via split dose instruction. Scope In: 11:30:12 AM Scope Out: 11:52:06 AM Scope Withdrawal Time: 0 hours 14 minutes 12 seconds  Total Procedure Duration: 0 hours 21 minutes 54 seconds  Findings:                 The perianal and digital rectal examinations were                            normal.  Multiple diverticula were found in the sigmoid                            colon and descending colon.                           Internal hemorrhoids were found.                           The exam was otherwise without abnormality on                            direct and retroflexion views. Complications:            No immediate complications. Estimated Blood Loss:     Estimated blood loss: none. Impression:               - Diverticulosis in the sigmoid colon and in the                            descending colon.                           - Internal hemorrhoids.                           - The examination was otherwise normal on direct                            and retroflexion views.                           - No specimens collected.                           - Personal history of colonic polyps. 4 diminutive                            adenomas  2020 Recommendation:           - Patient has a contact number available for                            emergencies. The signs and symptoms of potential                            delayed complications were discussed with the                            patient. Return to normal activities tomorrow.                            Written discharge instructions were provided to the                            patient.                           -  Resume previous diet.                           - Continue present medications.                           - Repeat colonoscopy in 3 years for surveillance.                            Though prep adequate - significant lavage required                            and the cecum kept filling from IC valve and was                            difficult to clear also so repeat exam in 3 years. Kenney Peacemaker, MD 11/07/2023 12:00:40 PM This report has been signed electronically.

## 2023-11-07 NOTE — Progress Notes (Signed)
 Sedate, gd SR, tolerated procedure well, VSS, report to RN

## 2023-11-07 NOTE — Patient Instructions (Addendum)
 No polyps seen today.  The prep limited the exam slightly.  Still have diverticulosis and hemorrhoids.  I recommend repeat colonoscopy in 3 years.  I appreciate the opportunity to care for you. Kenney Peacemaker, MD, FACG  YOU HAD AN ENDOSCOPIC PROCEDURE TODAY AT THE  ENDOSCOPY CENTER:   Refer to the procedure report that was given to you for any specific questions about what was found during the examination.  If the procedure report does not answer your questions, please call your gastroenterologist to clarify.  If you requested that your care partner not be given the details of your procedure findings, then the procedure report has been included in a sealed envelope for you to review at your convenience later.  YOU SHOULD EXPECT: Some feelings of bloating in the abdomen. Passage of more gas than usual.  Walking can help get rid of the air that was put into your GI tract during the procedure and reduce the bloating. If you had a lower endoscopy (such as a colonoscopy or flexible sigmoidoscopy) you may notice spotting of blood in your stool or on the toilet paper. If you underwent a bowel prep for your procedure, you may not have a normal bowel movement for a few days.  Please Note:  You might notice some irritation and congestion in your nose or some drainage.  This is from the oxygen used during your procedure.  There is no need for concern and it should clear up in a day or so.  SYMPTOMS TO REPORT IMMEDIATELY:  Following lower endoscopy (colonoscopy or flexible sigmoidoscopy):  Excessive amounts of blood in the stool  Significant tenderness or worsening of abdominal pains  Swelling of the abdomen that is new, acute  Fever of 100F or higher  Following upper endoscopy (EGD)  Vomiting of blood or coffee ground material  New chest pain or pain under the shoulder blades  Painful or persistently difficult swallowing  New shortness of breath  Fever of 100F or higher  Black,  tarry-looking stools  For urgent or emergent issues, a gastroenterologist can be reached at any hour by calling [phone]. Do not use MyChart messaging for urgent concerns.    DIET:  We do recommend a small meal at first, but then you may proceed to your regular diet.  Drink plenty of fluids but you should avoid alcoholic beverages for 24 hours.  ACTIVITY:  You should plan to take it easy for the rest of today and you should NOT DRIVE or use heavy machinery until tomorrow (because of the sedation medicines used during the test).    FOLLOW UP: Our staff will call the number listed on your records the next business day following your procedure.  We will call around 7:15- 8:00 am to check on you and address any questions or concerns that you may have regarding the information given to you following your procedure. If we do not reach you, we will leave a message.     If any biopsies were taken you will be contacted by phone or by letter within the next 1-3 weeks.  Please call us  at [phone] if you have not heard about the biopsies in 3 weeks.    SIGNATURES/CONFIDENTIALITY: You and/or your care partner have signed paperwork which will be entered into your electronic medical record.  These signatures attest to the fact that that the information above on your After Visit Summary has been reviewed and is understood.  Full responsibility of the confidentiality of  this discharge information lies with you and/or your care-partner.

## 2023-11-07 NOTE — Progress Notes (Signed)
 Pt's states no medical or surgical changes since previsit or office visit.

## 2023-11-08 ENCOUNTER — Telehealth: Payer: Self-pay

## 2023-11-08 NOTE — Telephone Encounter (Signed)
 No answer after follow up call. Voice message left.

## 2023-11-24 ENCOUNTER — Ambulatory Visit (INDEPENDENT_AMBULATORY_CARE_PROVIDER_SITE_OTHER): Admitting: Family Medicine

## 2023-11-24 ENCOUNTER — Encounter (INDEPENDENT_AMBULATORY_CARE_PROVIDER_SITE_OTHER): Payer: Self-pay | Admitting: Family Medicine

## 2023-11-24 VITALS — BP 118/70 | HR 66 | Temp 98.2°F | Ht 72.0 in | Wt 248.0 lb

## 2023-11-24 DIAGNOSIS — G4733 Obstructive sleep apnea (adult) (pediatric): Secondary | ICD-10-CM | POA: Diagnosis not present

## 2023-11-24 DIAGNOSIS — I10 Essential (primary) hypertension: Secondary | ICD-10-CM | POA: Diagnosis not present

## 2023-11-24 DIAGNOSIS — Z6833 Body mass index (BMI) 33.0-33.9, adult: Secondary | ICD-10-CM | POA: Diagnosis not present

## 2023-11-24 DIAGNOSIS — E669 Obesity, unspecified: Secondary | ICD-10-CM | POA: Diagnosis not present

## 2023-11-24 MED ORDER — TIRZEPATIDE 5 MG/0.5ML ~~LOC~~ SOAJ: 5.0000 mg | SUBCUTANEOUS | 0 refills | Status: DC

## 2023-11-24 NOTE — Assessment & Plan Note (Signed)
 Patient on Mounjaro  weekly as well as CPAP.  Needs a refill of his medication today- will increase to 5mg  weekly.  5mg  dose sent in today.

## 2023-11-24 NOTE — Progress Notes (Unsigned)
   SUBJECTIVE:  Chief Complaint: Obesity  Interim History: Patient is headed to [place]  tomorrow for a week.  He feels like he has stalled out food wise.  Thinks he is averaging around getting 50% of total intake in.  Thinks lunch is the most difficult.  He is just not hungry.  Sometimes he is skipping lunch.   Miguel Peterson is here to discuss his progress with his obesity treatment plan. He is on the Category 4 Plan and states he is following his eating plan approximately 50 % of the time. He states he is exercising 30 minutes 5 times per week- he is walking. He does feel like he can continue this physical activity even with the heat.    OBJECTIVE: Visit Diagnoses: Problem List Items Addressed This Visit       Respiratory   OSA (obstructive sleep apnea) - Primary   Patient on Mounjaro  weekly as well as CPAP.  Needs a refill of his medication today- will increase to 5mg  weekly.  5mg  dose sent in today.      Relevant Medications   tirzepatide  (MOUNJARO ) 5 MG/0.5ML Pen   Other Visit Diagnoses       Essential hypertension         Obesity with starting BMI of 37.1       Relevant Medications   tirzepatide  (MOUNJARO ) 5 MG/0.5ML Pen     BMI 33.0-33.9,adult           Vitals Temp: 98.2 F (36.8 C) BP: 118/70 Pulse Rate: 66 SpO2: 95 %   Anthropometric Measurements Height: 6' (1.829 m) Weight: 248 lb (112.5 kg) BMI (Calculated): 33.63 Weight at Last Visit: 247 lb Weight Lost Since Last Visit: 0 Weight Gained Since Last Visit: 1 lb Starting Weight: 273 lb Total Weight Loss (lbs): 25 lb (11.3 kg)   Body Composition  Body Fat %: 33.7 % Fat Mass (lbs): 83.8 lbs Muscle Mass (lbs): 156.4 lbs Total Body Water (lbs): 113.2 lbs Visceral Fat Rating : 21   Other Clinical Data Fasting: no Labs: no Today's Visit #: 17 Starting Date: 07/06/22 Comments: cat 4     ASSESSMENT AND PLAN:  Diet: Gerron is currently in the action stage of change. As such, his goal is to continue with  weight loss efforts and has agreed to the Category 4 Plan.   Exercise:  Older adults should determine their level of effort for physical activity relative to their level of fitness.  Behavior Modification:  We discussed the following Behavioral Modification Strategies today: increasing lean protein intake, decreasing simple carbohydrates, increasing vegetables, meal planning and cooking strategies, keeping healthy foods in the home, avoiding temptations, and planning for success.   Return in about 6 weeks (around 01/05/2024).   He was informed of the importance of frequent follow up visits to maximize his success with intensive lifestyle modifications for his multiple health conditions.  Attestation Statements:   Reviewed by clinician on day of visit: allergies, medications, problem list, medical history, surgical history, family history, social history, and previous encounter notes.     Donaciano Frizzle, MD

## 2023-11-25 NOTE — Assessment & Plan Note (Signed)
 Blood pressure well controlled today.  On losartan  50mg  and no chest pain, chest pressure or headache mentioned.  No change in med or dosage.

## 2023-12-19 DIAGNOSIS — E291 Testicular hypofunction: Secondary | ICD-10-CM | POA: Diagnosis not present

## 2023-12-19 DIAGNOSIS — F5221 Male erectile disorder: Secondary | ICD-10-CM | POA: Diagnosis not present

## 2023-12-19 DIAGNOSIS — R972 Elevated prostate specific antigen [PSA]: Secondary | ICD-10-CM | POA: Diagnosis not present

## 2024-01-05 ENCOUNTER — Encounter (INDEPENDENT_AMBULATORY_CARE_PROVIDER_SITE_OTHER): Payer: Self-pay | Admitting: Family Medicine

## 2024-01-05 ENCOUNTER — Ambulatory Visit (INDEPENDENT_AMBULATORY_CARE_PROVIDER_SITE_OTHER): Admitting: Family Medicine

## 2024-01-05 VITALS — BP 122/74 | HR 53 | Temp 97.7°F | Ht 72.0 in | Wt 246.0 lb

## 2024-01-05 DIAGNOSIS — E669 Obesity, unspecified: Secondary | ICD-10-CM

## 2024-01-05 DIAGNOSIS — Z6833 Body mass index (BMI) 33.0-33.9, adult: Secondary | ICD-10-CM | POA: Diagnosis not present

## 2024-01-05 DIAGNOSIS — E7849 Other hyperlipidemia: Secondary | ICD-10-CM | POA: Diagnosis not present

## 2024-01-05 NOTE — Progress Notes (Signed)
   SUBJECTIVE:  Chief Complaint: Obesity  Interim History: Patient mentions next week he is headed to [place]  and is ready for cooler weather. He has been dealing with the heat over the last month.  He is feeling like he is still treading water weight wise.  Does not always think he is getting enough in.  Feels like he is missing a bit at every meal.  He is going to go whale watching, train to [place], dog sledding and float plane.  No real side effects of Mounjaro  except for constipation occasionally.  Miguel Peterson is here to discuss his progress with his obesity treatment plan. He is on the Category 4 Plan and states he is following his eating plan approximately 60 % of the time. He states he is exercising 30 minutes 5 times per week.   OBJECTIVE: Visit Diagnoses: Problem List Items Addressed This Visit   None Visit Diagnoses       Other hyperlipidemia    -  Primary     Obesity with starting BMI of 37.1         BMI 33.0-33.9,adult           Vitals Temp: 97.7 F (36.5 C) BP: 122/74 Pulse Rate: (!) 53 SpO2: 98 %   Anthropometric Measurements Height: 6' (1.829 m) Weight: 246 lb (111.6 kg) BMI (Calculated): 33.36 Weight at Last Visit: 248lb Weight Lost Since Last Visit: 2lb Weight Gained Since Last Visit: 0 Starting Weight: 273lb Total Weight Loss (lbs): 27 lb (12.2 kg)   Body Composition  Body Fat %: 34.1 % Fat Mass (lbs): 84 lbs Muscle Mass (lbs): 154.2 lbs Total Body Water (lbs): 112.2 lbs Visceral Fat Rating : 21   Other Clinical Data Fasting: no Labs: no Today's Visit #: 18 Starting Date: 07/06/22 Comments: Cat 4     ASSESSMENT AND PLAN: Assessment & Plan Other hyperlipidemia Patient is currently taking Zetia , fish oils, and Zocor for treatment of cholesterols.  Will need repeat labs with primary care within the next 3 months to reevaluate treatment dosages and strategy.  Given that patient is currently taking Mounjaro  anticipating improvement in cholesterols  at next lab draw. Obesity with starting BMI of 37.1  BMI 33.0-33.9,adult    Diet: Miguel Peterson is currently in the action stage of change. As such, [name].   Exercise:  Older adults should determine their level of effort for physical activity relative to their level of fitness.  Behavior Modification:  We discussed the following Behavioral Modification Strategies today: increasing lean protein intake, decreasing simple carbohydrates, increasing vegetables, meal planning and cooking strategies, travel eating strategies, and planning for success.   Return in about 5 weeks (around 02/09/2024).   He was informed of the importance of frequent follow up visits to maximize his success with intensive lifestyle modifications for his multiple health conditions.  Attestation Statements:   Reviewed by clinician on day of visit: allergies, medications, problem list, medical history, surgical history, family history, social history, and previous encounter notes.     Adelita Cho, MD

## 2024-02-04 DIAGNOSIS — G4733 Obstructive sleep apnea (adult) (pediatric): Secondary | ICD-10-CM | POA: Diagnosis not present

## 2024-02-09 ENCOUNTER — Ambulatory Visit (INDEPENDENT_AMBULATORY_CARE_PROVIDER_SITE_OTHER): Admitting: Family Medicine

## 2024-02-09 ENCOUNTER — Encounter (INDEPENDENT_AMBULATORY_CARE_PROVIDER_SITE_OTHER): Payer: Self-pay | Admitting: Family Medicine

## 2024-02-09 VITALS — BP 154/77 | HR 61 | Temp 98.0°F | Ht 72.0 in | Wt 241.0 lb

## 2024-02-09 DIAGNOSIS — G4733 Obstructive sleep apnea (adult) (pediatric): Secondary | ICD-10-CM

## 2024-02-09 DIAGNOSIS — E669 Obesity, unspecified: Secondary | ICD-10-CM | POA: Diagnosis not present

## 2024-02-09 DIAGNOSIS — Z6832 Body mass index (BMI) 32.0-32.9, adult: Secondary | ICD-10-CM

## 2024-02-09 DIAGNOSIS — I10 Essential (primary) hypertension: Secondary | ICD-10-CM

## 2024-02-09 MED ORDER — TIRZEPATIDE 5 MG/0.5ML ~~LOC~~ SOAJ: 5.0000 mg | SUBCUTANEOUS | 0 refills | Status: DC

## 2024-02-09 NOTE — Assessment & Plan Note (Signed)
 Blood pressure slightly elevated today.  No chest pain, chest pressure or headache.  Previously BP has been well controlled so this is outlying BP.  Follow up on BP at next appointment.

## 2024-02-09 NOTE — Progress Notes (Unsigned)
 SUBJECTIVE:  Chief Complaint: Obesity  Interim History: patient just got back from a cruise to [place]  and mentions 2 days of ativities due to fog.  He was able to go on a dog sled ride on land when the [place] dog sled was canceled.  He was mindful of food choices while away.  He is still basically doing the same thing.  Next trip is November in [place].    Miguel Peterson is here to discuss his progress with his obesity treatment plan. He is on the Category 4 Plan and states he is following his eating plan approximately 60 % of the time. He states he is exercising 30 minutes 5 times per week.  During that 30 minutes he is doing mostly treadmill.  Hasn't gone back to the gym yet.    OBJECTIVE: Visit Diagnoses: Problem List Items Addressed This Visit       Cardiovascular and Mediastinum   Essential hypertension   Blood pressure slightly elevated today.  No chest pain, chest pressure or headache.  Previously BP has been well controlled so this is outlying BP.  Follow up on BP at next appointment.        Respiratory   OSA (obstructive sleep apnea) - Primary   Relevant Medications   tirzepatide  (MOUNJARO ) 5 MG/0.5ML Pen   Other Visit Diagnoses       Primary hypertension         Obesity with starting BMI of 37.1       Relevant Medications   tirzepatide  (MOUNJARO ) 5 MG/0.5ML Pen     BMI 32.0-32.9,adult           Vitals Temp: 98 F (36.7 C) BP: (!) 154/77 Pulse Rate: 61 SpO2: 99 %   Anthropometric Measurements Height: 6' (1.829 m) Weight: 241 lb (109.3 kg) BMI (Calculated): 32.68 Weight at Last Visit: 246 lb Weight Lost Since Last Visit: 5 Weight Gained Since Last Visit: 0 Starting Weight: 273 lb Total Weight Loss (lbs): 32 lb (14.5 kg)   Body Composition  Body Fat %: 33.7 % Fat Mass (lbs): 81.4 lbs Muscle Mass (lbs): 152.4 lbs Total Body Water (lbs): 110.6 lbs Visceral Fat Rating : 20   Other Clinical Data Today's Visit #: 19 Starting Date:  07/06/22 Comments: Cat 4     ASSESSMENT AND PLAN: Assessment & Plan OSA (obstructive sleep apnea) Patient is on tirzepatide  with no GI side effects.  Needs refill today.  Continue current medication at same dose.  Primary hypertension Blood pressure elevated today.  No chest pain, chest pressure or headache.  Previous blood pressures well controlled so will follow up on BP at next appointments. Obesity with starting BMI of 37.1  BMI 32.0-32.9,adult  Essential hypertension Blood pressure slightly elevated today.  No chest pain, chest pressure or headache.  Previously BP has been well controlled so this is outlying BP.  Follow up on BP at next appointment.   Diet: Terrick is currently in the action stage of change. As such, [name].   Exercise:  Older adults should determine their level of effort for physical activity relative to their level of fitness.  Behavior Modification:  We discussed the following Behavioral Modification Strategies today: increasing lean protein intake, decreasing simple carbohydrates, increasing vegetables, meal planning and cooking strategies, and planning for success. We discussed various medication options to help Herb with his weight loss efforts and we both agreed to  continue current dose of tirzepatide .  Return in about 7 weeks (around 03/29/2024).   He was informed of the importance of frequent follow up visits to maximize his success with intensive lifestyle modifications for his multiple health conditions.  Attestation Statements:   Reviewed by clinician on day of visit: allergies, medications, problem list, medical history, surgical history, family history, social history, and previous encounter notes.    Adelita Cho, MD

## 2024-02-10 NOTE — Assessment & Plan Note (Signed)
 Patient is on tirzepatide  with no GI side effects.  Needs refill today.  Continue current medication at same dose.

## 2024-02-21 ENCOUNTER — Ambulatory Visit: Admitting: Adult Health

## 2024-02-21 ENCOUNTER — Encounter: Payer: Self-pay | Admitting: Adult Health

## 2024-02-21 VITALS — BP 128/70 | HR 70 | Ht 72.0 in | Wt 245.5 lb

## 2024-02-21 DIAGNOSIS — G4733 Obstructive sleep apnea (adult) (pediatric): Secondary | ICD-10-CM | POA: Diagnosis not present

## 2024-02-21 NOTE — Patient Instructions (Addendum)
 Wear CPAP all night long for at least 6hr or more .  Work on healthy weight loss  Do not drive if sleepy  Order new mask-Resmed F30i full face mask.  Follow up with Dr. Jude  or Werner Labella NP in 1 year and As needed

## 2024-02-21 NOTE — Progress Notes (Unsigned)
 @Patient  ID: Miguel Peterson, male    DOB: 08/27/1950, 73 y.o.   MRN: [id number]  Chief Complaint  Patient presents with   Sleep Apnea    Referring provider: Windy Coy, MD  HPI: 73 yo male seen for sleep consult May 2024 to establish for OSA  TEST/EVENTS :  Home sleep test 08/2022 AHI 36/hour low saturation 83%.    02/21/2024 Follow up: OSA  Patient presents for a 1 year follow up.    No Known Allergies  Immunization History  Administered Date(s) Administered   PFIZER Comirnaty(Gray Top)Covid-19 Tri-Sucrose Vaccine 02/20/2024   PFIZER(Purple Top)SARS-COV-2 Vaccination 06/25/2019, 07/16/2019    Past Medical History:  Diagnosis Date   High blood pressure    High cholesterol    Hx of adenomatous colonic polyps 12/12/2018    Tobacco History: Social History   Tobacco Use  Smoking Status Never  Smokeless Tobacco Never   Counseling given: Not Answered   Outpatient Medications Prior to Visit  Medication Sig Dispense Refill   albuterol (VENTOLIN HFA) 108 (90 Base) MCG/ACT inhaler Inhale into the lungs.     azithromycin (ZITHROMAX) 250 MG tablet Take 250 mg by mouth as directed.     chlorpheniramine-HYDROcodone (TUSSIONEX) 10-8 MG/5ML Take by mouth.     ezetimibe  (ZETIA ) 10 MG tablet Take 10 mg by mouth daily.     losartan  (COZAAR ) 50 MG tablet Take 1 tablet (50 mg total) by mouth daily. 90 tablet 3   Multiple Vitamins-Minerals (CENTRUM SILVER 50+MEN PO) Take by mouth.     Omega-3 Fatty Acids (FISH OIL) 1200 MG CAPS Take by mouth.     ondansetron (ZOFRAN-ODT) 4 MG disintegrating tablet Take 2 mg by mouth every 6 (six) hours as needed.     scopolamine (TRANSDERM-SCOP) 1 MG/3DAYS 1 patch every 3 (three) days.     sildenafil (REVATIO) 20 MG tablet Take 20 mg by mouth 3 (three) times daily.     simvastatin (ZOCOR) 40 MG tablet Take 40 mg by mouth daily.     tirzepatide  (MOUNJARO ) 5 MG/0.5ML Pen Inject 5 mg into the skin once a week. 6 mL 0   Vitamin D ,  Cholecalciferol, 25 MCG (1000 UT) CAPS Take 25 mcg by mouth.     No facility-administered medications prior to visit.     Review of Systems:   Constitutional:   No  weight loss, night sweats,  Fevers, chills, fatigue, or  lassitude.  HEENT:   No headaches,  Difficulty swallowing,  Tooth/dental problems, or  Sore throat,                No sneezing, itching, ear ache, nasal congestion, post nasal drip,   CV:  No chest pain,  Orthopnea, PND, swelling in lower extremities, anasarca, dizziness, palpitations, syncope.   GI  No heartburn, indigestion, abdominal pain, nausea, vomiting, diarrhea, change in bowel habits, loss of appetite, bloody stools.   Resp: No shortness of breath with exertion or at rest.  No excess mucus, no productive cough,  No non-productive cough,  No coughing up of blood.  No change in color of mucus.  No wheezing.  No chest wall deformity  Skin: no rash or lesions.  GU: no dysuria, change in color of urine, no urgency or frequency.  No flank pain, no hematuria   MS:  No joint pain or swelling.  No decreased range of motion.  No back pain.    Physical Exam  BP 128/70 (BP Location: Left Arm, Patient Position: Sitting,  Cuff Size: Normal)   Pulse 70   Ht 6' (1.829 m)   Wt 245 lb 8.6 oz (111.4 kg)   SpO2 97% Comment: RA  BMI 33.30 kg/m   GEN: A/Ox3; pleasant , NAD, well nourished    HEENT:  [place]/AT,  EACs-clear, TMs-wnl, NOSE-clear, THROAT-clear, no lesions, no postnasal drip or exudate noted.   NECK:  Supple w/ fair ROM; no JVD; normal carotid impulses w/o bruits; no thyromegaly or nodules palpated; no lymphadenopathy.    RESP  Clear  P & A; w/o, wheezes/ rales/ or rhonchi. no accessory muscle use, no dullness to percussion  CARD:  RRR, no m/r/g, no peripheral edema, pulses intact, no cyanosis or clubbing.  GI:   Soft & nt; nml bowel sounds; no organomegaly or masses detected.   Musco: Warm bil, no deformities or joint swelling noted.   Neuro: alert, no  focal deficits noted.    Skin: Warm, no lesions or rashes    Lab Results:  CBC    Component Value Date/Time   WBC 4.9 05/13/2022 0000   RBC 5.27 (A) 06/13/2023 0000   HGB 15.8 06/13/2023 0000   HCT 93 (A) 06/13/2023 0000   PLT 246 06/13/2023 0000    BMET    Component Value Date/Time   NA 141 06/13/2023 0000   K 4.1 06/13/2023 0000   CL 107 06/13/2023 0000   CO2 19 06/13/2023 0000   GLUCOSE 111 (H) 07/17/2020 1107   BUN 19 06/13/2023 0000   CREATININE 1.2 06/13/2023 0000   CREATININE 1.07 07/17/2020 1107   CALCIUM 9.2 06/13/2023 0000   GFRNONAA 71 07/17/2020 1107   GFRAA 82 07/17/2020 1107    BNP No results found for: BNP  ProBNP No results found for: PROBNP  Imaging: No results found.  Administration History     None           No data to display          No results found for: NITRICOXIDE      Assessment & Plan:   No problem-specific Assessment & Plan notes found for this encounter.     Madelin Stank, NP 02/21/2024

## 2024-02-21 NOTE — Assessment & Plan Note (Signed)
  Plan  Patient Instructions  Wear CPAP all night long for at least 6hr or more .  Work on healthy weight loss  Do not drive if sleepy  Order new mask-Resmed F30i full face mask.  Follow up with Dr. Jude  or Walden Statz NP in 1 year and As needed

## 2024-03-11 NOTE — Progress Notes (Unsigned)
 Cardiology Office Note:    Date:  03/11/2024   ID:  Miguel Peterson, DOB December 02, 1950, MRN [id number]  PCP:  Windy Coy, MD  Cardiologist:  None  Electrophysiologist:  None   Referring MD: Windy Coy, MD   No chief complaint on file.   History of Present Illness:    Miguel Peterson is a 73 y.o. male with a hx of CAD, hyperlipidemia who presents for follow-up.  He was referred by Dr. Windy for an evaluation of chest pain, initially seen on 09/10/2019.    Coronary CTA on 09/27/2019 showed noncalcified plaque in the proximal RCA causing minimal (25 to 49%) stenosis and calcified plaque in proximal LAD and ostium of small ramus branch causing minimal (0 to 24%) stenosis.  Calcium score 14 (27th percentile).  Reported worsening chest pain, underwent stress PET 08/2022 which showed normal perfusion, EF 67%, normal myocardial blood flow reserve, mild coronary calcifications.  Echocardiogram 09/02/2022 showed EF 60 to 65%, normal RV systolic function, no significant valvular disease.   Since last clinic visit,  he reports he is doing well.  Denies any chest pain, dyspnea, lightheadedness, syncope, lower extremity edema, or palpitations or pain in legs with walking.  He has been going to the gym a couple times per week, does weight machines and walks daily for 20 to 30 minutes.  Denies exertional symptoms.   Past Medical History:  Diagnosis Date   High blood pressure    High cholesterol    Hx of adenomatous colonic polyps 12/12/2018    Past Surgical History:  Procedure Laterality Date   COLONOSCOPY     CYST REMOVAL NECK     benign/ long time ago per pt.   TONSILLECTOMY     as a child    Current Medications: No outpatient medications have been marked as taking for the 03/16/24 encounter (Appointment) with Kate Lonni CROME, MD.     Allergies:   Patient has no known allergies.   Social History   Socioeconomic History   Marital status: Married    Spouse name: Not on  file   Number of children: Not on file   Years of education: Not on file   Highest education level: Not on file  Occupational History   Occupation: PROFESSOR    Employer: [employer]  Tobacco Use   Smoking status: Never   Smokeless tobacco: Never  Vaping Use   Vaping status: Never Used  Substance and Sexual Activity   Alcohol use: Yes    Comment: 24 oz of beer   Drug use: Never   Sexual activity: Not on file  Other Topics Concern   Not on file  Social History Narrative   Not on file   Social Drivers of Health   Financial Resource Strain: Not on file  Food Insecurity: Not on file  Transportation Needs: Not on file  Physical Activity: Not on file  Stress: Not on file  Social Connections: Not on file     Family History: The patient's family history includes Healthy in his brother, brother, sister, and sister; Heart disease in his father; Obesity in his father; Sudden death in his father. There is no history of Colon cancer, Colon polyps, Esophageal cancer, Rectal cancer, or Stomach cancer.  ROS:   Please see the history of present illness.     All other systems reviewed and are negative.  EKGs/Labs/Other Studies Reviewed:    The following studies were reviewed today:   EKG:   01/06/2023: Sinus  rhythm, PACs, rate 75, nonspecific T wave flattening  Recent Labs: 06/13/2023: ALT 32; BUN 19; Creatinine 1.2; Hemoglobin 15.8; Platelets 246; Potassium 4.1; Sodium 141; TSH 5.34  Recent Lipid Panel    Component Value Date/Time   CHOL 92 06/13/2023 0000   CHOL 108 07/17/2020 1107   TRIG 91 06/13/2023 0000   HDL 45 06/13/2023 0000   HDL 49 07/17/2020 1107   CHOLHDL 2.2 07/17/2020 1107   LDLCALC 30 06/13/2023 0000   LDLCALC 43 07/17/2020 1107    Physical Exam:    VS:  There were no vitals taken for this visit.    Wt Readings from Last 3 Encounters:  02/21/24 245 lb 8.6 oz (111.4 kg)  02/09/24 241 lb (109.3 kg)  01/05/24 246 lb (111.6 kg)     GEN:  Well  nourished, well developed in no acute distress HEENT: Normal NECK: No JVD; No carotid bruits CARDIAC: RRR, no murmurs, rubs, gallops RESPIRATORY:  Clear to auscultation without rales, wheezing or rhonchi  ABDOMEN: Soft, non-tender, non-distended MUSCULOSKELETAL:  No edema; No deformity  SKIN: Warm and dry NEUROLOGIC:  Alert and oriented x 3 PSYCHIATRIC:  Normal affect   ASSESSMENT:    No diagnosis found.   PLAN:     CAD: Coronary CTA on 09/27/2019 showed noncalcified plaque in the proximal RCA causing minimal (25 to 49%) stenosis and calcified plaque in proximal LAD and ostium of small ramus branch causing minimal (0 to 24%) stenosis.  Calcium score 14 (27th percentile).  Reported worsening chest pain, underwent stress PET 08/2022 which showed normal perfusion, EF 67%, normal myocardial blood flow reserve, mild coronary calcifications.  Echocardiogram 09/02/2022 showed EF 60 to 65%, normal RV systolic function, no significant valvular disease. -Continue simvastatin 40 mg daily and Zetia  10 mg daily.     Hyperlipidemia: Had been on atorvastatin 80 mg daily, but held due to transaminitis.  LDL 130 on 09/10/2019 off of atorvastatin.  Now on simvastatin 40 mg daily and zetia  10 mg daily, LDL 30 05/2022   Hypertension: On losartan  50 mg daily.  Appears controlled.  Obesity: There is no height or weight on file to calculate BMI.  Being seen at [facility].  OSA: On CPAP, follows with pulmonology   RTC in 1 year***     Medication Adjustments/Labs and Tests Ordered: Current medicines are reviewed at length with the patient today.  Concerns regarding medicines are outlined above.  No orders of the defined types were placed in this encounter.  No orders of the defined types were placed in this encounter.   There are no Patient Instructions on file for this visit.   Signed, Lonni LITTIE Nanas, MD  03/11/2024 2:40 PM    [facility]

## 2024-03-15 ENCOUNTER — Ambulatory Visit (INDEPENDENT_AMBULATORY_CARE_PROVIDER_SITE_OTHER): Payer: Medicare PPO | Admitting: Otolaryngology

## 2024-03-15 ENCOUNTER — Encounter (INDEPENDENT_AMBULATORY_CARE_PROVIDER_SITE_OTHER): Payer: Self-pay | Admitting: Otolaryngology

## 2024-03-15 VITALS — BP 145/87 | HR 60 | Temp 97.5°F | Ht 71.0 in | Wt 240.0 lb

## 2024-03-15 DIAGNOSIS — H6123 Impacted cerumen, bilateral: Secondary | ICD-10-CM

## 2024-03-15 DIAGNOSIS — H903 Sensorineural hearing loss, bilateral: Secondary | ICD-10-CM | POA: Diagnosis not present

## 2024-03-15 NOTE — Progress Notes (Signed)
 Patient ID: Miguel Peterson, male   DOB: 1951-02-08, 73 y.o.   MRN: [id number]  Follow-up: Hearing loss  HPI: The patient is a 73 year old male who returns today for his follow-up evaluation.  The patient has a history of bilateral high-frequency sensorineural hearing loss.  He was treated with hearing amplification.  However, he did not keep the hearing aids.  The patient returns today reporting no significant difficulty.  He is still having difficulty hearing, especially in noisy environments. Currently he denies any otalgia, otorrhea, or vertigo.  Exam: General: Communicates without difficulty, well nourished, no acute distress. Head: Normocephalic, no evidence injury, no tenderness, facial buttresses intact without stepoff. Face/sinus: No tenderness to palpation and percussion. Facial movement is normal and symmetric. Eyes: PERRL, EOMI. No scleral icterus, conjunctivae clear. Neuro: CN II exam reveals vision grossly intact.  No nystagmus at any point of gaze. EAC: Bilateral cerumen impaction.  Under the operating microscope, the cerumen is carefully removed with a combination of cerumen currette, alligator forceps, and suction catheters.  After the cerumen is removed, the TMs are noted to be normal. Nose: External evaluation reveals normal support and skin without lesions.  Dorsum is intact.  Anterior rhinoscopy reveals pink mucosa over anterior aspect of inferior turbinates and intact septum.  No purulence noted. Oral:  Oral cavity and oropharynx are intact, symmetric, without erythema or edema.  Mucosa is moist without lesions. Neck: Full range of motion without pain.  There is no significant lymphadenopathy.  No masses palpable.  Thyroid bed within normal limits to palpation.  Parotid glands and submandibular glands equal bilaterally without mass.  Trachea is midline. Neuro:  CN 2-12 grossly intact. Gait normal.   Procedure: Bilateral cerumen disimpaction Anesthesia: None Description: Under the  operating microscope, the cerumen is carefully removed with a combination of cerumen currette, alligator forceps, and suction catheters.  After the cerumen is removed, the TMs are noted to be normal.  No mass, erythema, or lesions. The patient tolerated the procedure well.   Assessment: Bilateral cerumen impaction.  After the disimpaction procedure, both tympanic membranes and middle ear spaces are noted to be normal.  Subjectively stable bilateral high frquency sensorineural hearing loss.  Plan: 1.  Otomicroscopy with bilateral cerumen disimpaction.  2.  The physical exam findings are reviewed with the patient.  3.  The patient will return for re-evaluation in 1 year.

## 2024-03-16 ENCOUNTER — Encounter: Payer: Self-pay | Admitting: Cardiology

## 2024-03-16 ENCOUNTER — Ambulatory Visit: Attending: Cardiology | Admitting: Cardiology

## 2024-03-16 VITALS — BP 130/78 | HR 61 | Ht 72.0 in | Wt 244.6 lb

## 2024-03-16 DIAGNOSIS — I10 Essential (primary) hypertension: Secondary | ICD-10-CM

## 2024-03-16 DIAGNOSIS — I2510 Atherosclerotic heart disease of native coronary artery without angina pectoris: Secondary | ICD-10-CM

## 2024-03-16 DIAGNOSIS — E785 Hyperlipidemia, unspecified: Secondary | ICD-10-CM | POA: Diagnosis not present

## 2024-03-16 NOTE — Patient Instructions (Signed)
 Medication Instructions:  Your physician recommends that you continue on your current medications as directed. Please refer to the Current Medication list given to you today.  *If you need a refill on your cardiac medications before your next appointment, please call your pharmacy*  Lab Work: none If you have labs (blood work) drawn today and your tests are completely normal, you will receive your results only by: MyChart Message (if you have MyChart) OR A paper copy in the mail If you have any lab test that is abnormal or we need to change your treatment, we will call you to review the results.  Testing/Procedures: none  Follow-Up: At [facility] you and your health needs are our priority.  As part of our continuing mission to provide you with exceptional heart care, our providers are all part of one team.  This team includes your primary Cardiologist (physician) and Advanced Practice Providers or APPs (Physician Assistants and Nurse Practitioners) who all work together to provide you with the care you need, when you need it.  Your next appointment:   1 year  Provider:   Dr. Kate  We recommend signing up for the patient portal called MyChart.  Sign up information is provided on this After Visit Summary.  MyChart is used to connect with patients for Virtual Visits (Telemedicine).  Patients are able to view lab/test results, encounter notes, upcoming appointments, etc.  Non-urgent messages can be sent to your provider as well.   To learn more about what you can do with MyChart, go to [email or web address].   Other Instructions none

## 2024-03-26 DIAGNOSIS — Z23 Encounter for immunization: Secondary | ICD-10-CM | POA: Diagnosis not present

## 2024-03-29 ENCOUNTER — Ambulatory Visit (INDEPENDENT_AMBULATORY_CARE_PROVIDER_SITE_OTHER): Admitting: Family Medicine

## 2024-03-29 ENCOUNTER — Encounter (INDEPENDENT_AMBULATORY_CARE_PROVIDER_SITE_OTHER): Payer: Self-pay | Admitting: Family Medicine

## 2024-03-29 VITALS — BP 131/76 | HR 70 | Temp 98.2°F | Ht 72.0 in | Wt 239.0 lb

## 2024-03-29 DIAGNOSIS — G4733 Obstructive sleep apnea (adult) (pediatric): Secondary | ICD-10-CM

## 2024-03-29 DIAGNOSIS — R7303 Prediabetes: Secondary | ICD-10-CM

## 2024-03-29 DIAGNOSIS — E669 Obesity, unspecified: Secondary | ICD-10-CM | POA: Diagnosis not present

## 2024-03-29 DIAGNOSIS — Z6832 Body mass index (BMI) 32.0-32.9, adult: Secondary | ICD-10-CM

## 2024-03-29 NOTE — Progress Notes (Signed)
   SUBJECTIVE:  Chief Complaint: Obesity  Interim History: Patient leaving 2 weeks from today to go to [place].  Planning for a arvinmeritor everyday- some plans for what they will do everyday. Patient has been doing mostly the same in terms of food intake.  He is planning to not feeling any hunger and hasn't really noticed much of anything in terms of response to medication usage.  After his cruise he is going to [place] to [employer] and the [employer] at [place] .  He ans his wife are celebrating their 50th wedding anniversary with driving to [facility] for a few days.  Upcoming yearly physical in December  Jabbar is here to discuss his progress with his obesity treatment plan. He is on the Category 4 Plan and states he is following his eating plan approximately 60 % of the time. He states he is exercising 30 minutes 5 times per week.   OBJECTIVE: Visit Diagnoses: Problem List Items Addressed This Visit       Respiratory   OSA (obstructive sleep apnea) - Primary   Other Visit Diagnoses       Prediabetes         Obesity with starting BMI of 37.1         BMI 32.0-32.9,adult           Vitals Temp: 98.2 F (36.8 C) BP: 131/76 Pulse Rate: 70 SpO2: 98 %   Anthropometric Measurements Height: 6' (1.829 m) Weight: 239 lb (108.4 kg) BMI (Calculated): 32.41 Weight at Last Visit: 241 lb Weight Lost Since Last Visit: 2 Weight Gained Since Last Visit: 0 Starting Weight: 273 lb Total Weight Loss (lbs): 34 lb (15.4 kg)   Body Composition  Body Fat %: 31.9 % Fat Mass (lbs): 76.2 lbs Muscle Mass (lbs): 154.8 lbs Total Body Water (lbs): 109.6 lbs Visceral Fat Rating : 19   Other Clinical Data Today's Visit #: 20 Starting Date: 07/06/22 Comments: Cat 4     ASSESSMENT AND PLAN: Assessment & Plan OSA (obstructive sleep apnea) Doing well on current dose of tours appetite.  Patient continues to experience improved sleep quality and quantity.  No change in  current treatment. Prediabetes mounjaro .  Not checking blood sugars very frequently- not needed at this time.  No feelings of hypoglycemia.  Patient continues to workout frequently and consistently.  Continue current treatment plan and dosage. Obesity with starting BMI of 37.1  BMI 32.0-32.9,adult    Diet: Trennon is currently in the action stage of change. As such, [name].   Exercise:  Older adults should determine their level of effort for physical activity relative to their level of fitness.  Behavior Modification:  We discussed the following Behavioral Modification Strategies today: increasing lean protein intake, decreasing simple carbohydrates, increasing vegetables, meal planning and cooking strategies, and planning for success.   Return in about 8 weeks (around 05/24/2024).   He was informed of the importance of frequent follow up visits to maximize his success with intensive lifestyle modifications for his multiple health conditions.  Attestation Statements:   Reviewed by clinician on day of visit: allergies, medications, problem list, medical history, surgical history, family history, social history, and previous encounter notes.    Adelita Cho, MD

## 2024-04-08 NOTE — Assessment & Plan Note (Signed)
 Doing well on current dose of tours appetite.  Patient continues to experience improved sleep quality and quantity.  No change in current treatment.

## 2024-05-10 ENCOUNTER — Telehealth (INDEPENDENT_AMBULATORY_CARE_PROVIDER_SITE_OTHER): Payer: Self-pay

## 2024-05-10 NOTE — Telephone Encounter (Signed)
 [employer]  PA  Authorization already on file for this request. Authorization starting on 03/07/2024 and ending on 06/06/2025.

## 2024-05-15 ENCOUNTER — Encounter (INDEPENDENT_AMBULATORY_CARE_PROVIDER_SITE_OTHER): Payer: Self-pay | Admitting: Family Medicine

## 2024-05-15 ENCOUNTER — Ambulatory Visit (INDEPENDENT_AMBULATORY_CARE_PROVIDER_SITE_OTHER): Admitting: Family Medicine

## 2024-05-15 VITALS — BP 119/67 | HR 69 | Temp 98.0°F | Ht 72.0 in | Wt 239.0 lb

## 2024-05-15 DIAGNOSIS — E669 Obesity, unspecified: Secondary | ICD-10-CM | POA: Diagnosis not present

## 2024-05-15 DIAGNOSIS — G4733 Obstructive sleep apnea (adult) (pediatric): Secondary | ICD-10-CM

## 2024-05-15 DIAGNOSIS — Z6832 Body mass index (BMI) 32.0-32.9, adult: Secondary | ICD-10-CM | POA: Diagnosis not present

## 2024-05-15 MED ORDER — TIRZEPATIDE 5 MG/0.5ML ~~LOC~~ SOAJ: 5.0000 mg | SUBCUTANEOUS | 0 refills | Status: DC

## 2024-05-15 NOTE — Assessment & Plan Note (Signed)
 Patient feeling slightly less appetite on mounjaro .  He got a new CPAP recently due to it breaking down.  He is wearing it consistently without issue.

## 2024-05-15 NOTE — Progress Notes (Unsigned)
   SUBJECTIVE:  Chief Complaint: Obesity  Interim History: Patient here for 6 week follow up.  He went on a cruise since last appointment- 14 days with no sea days.  He mentions the weather was good- high to mid 80s daily.  He is planting to go to [employer] and [employer] [place]  in the upcoming weekend.  Possibly planning for winter activities up north.  Planning a trip to [place] in April.  Has been able to be mindful of food intake but not eating as much and trying to focus on maintaining his protein intake.    Miguel Peterson is here to discuss his progress with his obesity treatment plan. He is on the Category 4 Plan and states he is following his eating plan approximately 60 % of the time. He states he is not exercising as much.   OBJECTIVE: Visit Diagnoses: Problem List Items Addressed This Visit       Respiratory   OSA (obstructive sleep apnea) - Primary   Relevant Medications   tirzepatide  (MOUNJARO ) 5 MG/0.5ML Pen   Other Visit Diagnoses       BMI 32.0-32.9,adult         Obesity with starting BMI of 37.1       Relevant Medications   tirzepatide  (MOUNJARO ) 5 MG/0.5ML Pen       Vitals Temp: 98 F (36.7 C) BP: 119/67 Pulse Rate: 69 SpO2: 99 %   Anthropometric Measurements Height: 6' (1.829 m) Weight: 239 lb (108.4 kg) BMI (Calculated): 32.41 Weight at Last Visit: 239 lb Weight Lost Since Last Visit: 0 Weight Gained Since Last Visit: 0 Starting Weight: 273 lb Total Weight Loss (lbs): 34 lb (15.4 kg)   Body Composition  Body Fat %: 32.1 % Fat Mass (lbs): 76.8 lbs Muscle Mass (lbs): 154.2 lbs Total Body Water (lbs): 109 lbs Visceral Fat Rating : 20   Other Clinical Data Today's Visit #: 21 Starting Date: 07/06/22 Comments: Cat 4     ASSESSMENT AND PLAN: Assessment & Plan OSA (obstructive sleep apnea) Patient feeling slightly less appetite on mounjaro .  He got a new CPAP recently due to it breaking down.  He is wearing it consistently without  issue.   BMI 32.0-32.9,adult  Obesity with starting BMI of 37.1    Diet: Miguel Peterson is currently in the action stage of change. As such, [name].   Exercise:  Older adults should determine their level of effort for physical activity relative to their level of fitness.  Patient planning to start going to gym indoors now- aiming for 3 days a week.  Behavior Modification:  We discussed the following Behavioral Modification Strategies today: increasing lean protein intake, decreasing simple carbohydrates, increasing vegetables, meal planning and cooking strategies, holiday eating strategies, and planning for success. We discussed various medication options to help Miguel Peterson with his weight loss efforts and we both agreed to continue mounjaro  and stay at same dose without changes.  Return in about 8 weeks (around 07/10/2024).   He was informed of the importance of frequent follow up visits to maximize his success with intensive lifestyle modifications for his multiple health conditions.  Attestation Statements:   Reviewed by clinician on day of visit: allergies, medications, problem list, medical history, surgical history, family history, social history, and previous encounter notes.     Adelita Cho, MD

## 2024-05-17 ENCOUNTER — Ambulatory Visit (INDEPENDENT_AMBULATORY_CARE_PROVIDER_SITE_OTHER): Payer: Self-pay | Admitting: Family Medicine

## 2024-07-11 ENCOUNTER — Ambulatory Visit (INDEPENDENT_AMBULATORY_CARE_PROVIDER_SITE_OTHER): Admitting: Family Medicine

## 2024-07-11 ENCOUNTER — Encounter (INDEPENDENT_AMBULATORY_CARE_PROVIDER_SITE_OTHER): Payer: Self-pay | Admitting: Family Medicine

## 2024-07-11 VITALS — BP 126/72 | HR 62 | Temp 98.2°F | Ht 72.0 in | Wt 237.0 lb

## 2024-07-11 DIAGNOSIS — G4733 Obstructive sleep apnea (adult) (pediatric): Secondary | ICD-10-CM

## 2024-07-11 DIAGNOSIS — I10 Essential (primary) hypertension: Secondary | ICD-10-CM

## 2024-07-11 DIAGNOSIS — Z6832 Body mass index (BMI) 32.0-32.9, adult: Secondary | ICD-10-CM

## 2024-07-11 MED ORDER — TIRZEPATIDE 5 MG/0.5ML ~~LOC~~ SOAJ: 5.0000 mg | SUBCUTANEOUS | 0 refills | Status: AC

## 2024-07-11 NOTE — Progress Notes (Unsigned)
" ° °  SUBJECTIVE:  Chief Complaint: Obesity  Interim History: Patient here for first follow up after the holidays. Patient is going to [place] and [place] at the end of April for a couple of weeks. He will likely go up to [place]  in July.  Will be going back to [place] in August and then planning for [place] at the end of December. Wants to start walking outdoor when the snow melts.  He is likely getting all the food in at dinner but lunch tends to be difficult in order to get all food in. He may eat a sandwich.  Blanton is here to discuss his progress with his obesity treatment plan. He is on the Category 4 Plan and states he is following his eating plan approximately 60 % of the time. He states he is not exercising.  OBJECTIVE: Visit Diagnoses: Problem List Items Addressed This Visit       Respiratory   OSA (obstructive sleep apnea) - Primary   Relevant Medications   tirzepatide  (MOUNJARO ) 5 MG/0.5ML Pen   Other Visit Diagnoses       Primary hypertension         BMI 32.0-32.9,adult         Obesity with starting BMI of 37.1       Relevant Medications   tirzepatide  (MOUNJARO ) 5 MG/0.5ML Pen       Vitals Temp: 98.2 F (36.8 C) BP: 126/72 Pulse Rate: 62 SpO2: 98 %   Anthropometric Measurements Height: 6' (1.829 m) Weight: 237 lb (107.5 kg) BMI (Calculated): 32.14 Weight at Last Visit: 239 lb Weight Lost Since Last Visit: 2 Weight Gained Since Last Visit: 0 Starting Weight: 273 lb Total Weight Loss (lbs): 36 lb (16.3 kg)   Body Composition  Body Fat %: 32.4 % Fat Mass (lbs): 76.8 lbs Muscle Mass (lbs): 152.4 lbs Total Body Water (lbs): 109.6 lbs Visceral Fat Rating : 20   Other Clinical Data Today's Visit #: 22 Starting Date: 07/06/22 Comments: Cat 4     ASSESSMENT AND PLAN:  Diet: Travelle is currently in the action stage of change. As such, [name].   Exercise:  Older adults should  determine their level of effort for physical activity relative to their level of fitness.  Behavior Modification:  We discussed the following Behavioral Modification Strategies today: increasing lean protein intake, decreasing simple carbohydrates, increasing vegetables, meal planning and cooking strategies, and keeping healthy foods in the home. We discussed various medication options to help Tremain with his weight loss efforts and we both agreed to ***.  Return in about 6 weeks (around 08/22/2024).   He was informed of the importance of frequent follow up visits to maximize his success with intensive lifestyle modifications for his multiple health conditions.  Attestation Statements:   Reviewed by clinician on day of visit: allergies, medications, problem list, medical history, surgical history, family history, social history, and previous encounter notes.    Adelita Cho, MD "

## 2024-08-22 ENCOUNTER — Ambulatory Visit (INDEPENDENT_AMBULATORY_CARE_PROVIDER_SITE_OTHER): Admitting: Family Medicine
